# Patient Record
Sex: Female | Born: 1985 | Race: White | Hispanic: No | Marital: Married | Smoking: Former smoker
Health system: Southern US, Community
[De-identification: ages and names within clinical notes are randomized; demographics above are authoritative.]

## PROBLEM LIST (undated history)

## (undated) ENCOUNTER — Inpatient Hospital Stay (HOSPITAL_COMMUNITY): Payer: Self-pay

## (undated) DIAGNOSIS — O09299 Supervision of pregnancy with other poor reproductive or obstetric history, unspecified trimester: Secondary | ICD-10-CM

## (undated) DIAGNOSIS — Z789 Other specified health status: Secondary | ICD-10-CM

## (undated) HISTORY — PX: TYMPANOSTOMY TUBE PLACEMENT: SHX32

## (undated) HISTORY — DX: Supervision of pregnancy with other poor reproductive or obstetric history, unspecified trimester: O09.299

---

## 2008-04-09 ENCOUNTER — Other Ambulatory Visit: Admission: RE | Admit: 2008-04-09 | Discharge: 2008-04-09 | Payer: Self-pay | Admitting: Obstetrics and Gynecology

## 2009-04-14 ENCOUNTER — Other Ambulatory Visit: Admission: RE | Admit: 2009-04-14 | Discharge: 2009-04-14 | Payer: Self-pay | Admitting: Obstetrics and Gynecology

## 2011-12-19 NOTE — L&D Delivery Note (Signed)
Delivery Note At 11:35 PM a viable female was delivered via Vaginal, Spontaneous Delivery (Presentation: Left Occiput Anterior).  APGAR: , ; weight .   Placenta status: manually del>>>intact,  , .  Cord:  with the following complications: None.  Cord pH: not sent  Anesthesia: Epidural  Episiotomy: None Lacerations: sec deg Suture Repair: 3.0 chromic Est. Blood Loss (mL): 500  Mom to postpartum.  Baby to nursery-stable.  Meriel Pica 10/27/2012, 11:57 PM

## 2012-03-27 ENCOUNTER — Other Ambulatory Visit: Payer: Self-pay

## 2012-03-27 LAB — OB RESULTS CONSOLE RUBELLA ANTIBODY, IGM: Rubella: IMMUNE

## 2012-03-27 LAB — OB RESULTS CONSOLE HIV ANTIBODY (ROUTINE TESTING): HIV: NONREACTIVE

## 2012-04-03 LAB — OB RESULTS CONSOLE GC/CHLAMYDIA
Chlamydia: NEGATIVE
Gonorrhea: NEGATIVE

## 2012-07-01 ENCOUNTER — Other Ambulatory Visit (HOSPITAL_COMMUNITY): Payer: Self-pay | Admitting: Obstetrics and Gynecology

## 2012-07-01 DIAGNOSIS — Z0489 Encounter for examination and observation for other specified reasons: Secondary | ICD-10-CM

## 2012-07-09 ENCOUNTER — Ambulatory Visit (HOSPITAL_COMMUNITY)
Admission: RE | Admit: 2012-07-09 | Discharge: 2012-07-09 | Disposition: A | Discharge status: Home / Self Care | Payer: BC Managed Care – PPO | Source: Ambulatory Visit | Attending: Obstetrics and Gynecology | Admitting: Obstetrics and Gynecology

## 2012-07-09 ENCOUNTER — Encounter (HOSPITAL_COMMUNITY): Payer: Self-pay

## 2012-07-09 DIAGNOSIS — Z0489 Encounter for examination and observation for other specified reasons: Secondary | ICD-10-CM

## 2012-07-09 DIAGNOSIS — Z363 Encounter for antenatal screening for malformations: Secondary | ICD-10-CM | POA: Insufficient documentation

## 2012-07-09 DIAGNOSIS — O350XX0 Maternal care for (suspected) central nervous system malformation in fetus, not applicable or unspecified: Secondary | ICD-10-CM | POA: Insufficient documentation

## 2012-07-09 DIAGNOSIS — O358XX0 Maternal care for other (suspected) fetal abnormality and damage, not applicable or unspecified: Secondary | ICD-10-CM | POA: Insufficient documentation

## 2012-07-09 DIAGNOSIS — Z1389 Encounter for screening for other disorder: Secondary | ICD-10-CM | POA: Insufficient documentation

## 2012-07-09 DIAGNOSIS — O3500X0 Maternal care for (suspected) central nervous system malformation or damage in fetus, unspecified, not applicable or unspecified: Secondary | ICD-10-CM | POA: Insufficient documentation

## 2012-07-09 NOTE — Progress Notes (Signed)
Tonya Chan was seen for ultrasound appointment today.  Please see [facility] report for details.  

## 2012-09-25 LAB — OB RESULTS CONSOLE GBS: GBS: NEGATIVE

## 2012-10-27 ENCOUNTER — Encounter (HOSPITAL_COMMUNITY): Payer: Self-pay | Admitting: Anesthesiology

## 2012-10-27 ENCOUNTER — Inpatient Hospital Stay (HOSPITAL_COMMUNITY): Payer: BC Managed Care – PPO | Admitting: Anesthesiology

## 2012-10-27 ENCOUNTER — Inpatient Hospital Stay (HOSPITAL_COMMUNITY)
Admission: AD | Admit: 2012-10-27 | Discharge: 2012-10-29 | DRG: 373 | Disposition: A | Discharge status: Home / Self Care | Payer: BC Managed Care – PPO | Source: Ambulatory Visit | Attending: Obstetrics and Gynecology | Admitting: Obstetrics and Gynecology

## 2012-10-27 ENCOUNTER — Encounter (HOSPITAL_COMMUNITY): Payer: Self-pay | Admitting: Family Medicine

## 2012-10-27 ENCOUNTER — Encounter (HOSPITAL_COMMUNITY): Payer: Self-pay | Admitting: *Deleted

## 2012-10-27 HISTORY — DX: Other specified health status: Z78.9

## 2012-10-27 LAB — CBC
HCT: 41.5 % (ref 36.0–46.0)
MCH: 32.2 pg (ref 26.0–34.0)
MCV: 93.5 fL (ref 78.0–100.0)
Platelets: 233 10*3/uL (ref 150–400)
RBC: 4.44 MIL/uL (ref 3.87–5.11)
RDW: 13.4 % (ref 11.5–15.5)
WBC: 10.8 10*3/uL — ABNORMAL HIGH (ref 4.0–10.5)

## 2012-10-27 LAB — TYPE AND SCREEN

## 2012-10-27 MED ORDER — OXYCODONE-ACETAMINOPHEN 5-325 MG PO TABS: 1.0000 | ORAL_TABLET | ORAL | Status: DC | PRN

## 2012-10-27 MED ORDER — LIDOCAINE HCL (PF) 1 % IJ SOLN
30.0000 mL | INTRAMUSCULAR | Status: DC | PRN
  Filled 2012-10-27: qty 30

## 2012-10-27 MED ORDER — FENTANYL 2.5 MCG/ML BUPIVACAINE 1/10 % EPIDURAL INFUSION (WH - ANES)
14.0000 mL/h | INTRAMUSCULAR | Status: DC
  Filled 2012-10-27: qty 125

## 2012-10-27 MED ORDER — EPHEDRINE 5 MG/ML INJ
10.0000 mg | INTRAVENOUS | Status: DC | PRN
  Filled 2012-10-27: qty 4

## 2012-10-27 MED ORDER — LIDOCAINE HCL (PF) 1 % IJ SOLN
INTRAMUSCULAR | Status: DC | PRN
  Administered 2012-10-27 (×2): 9 mL

## 2012-10-27 MED ORDER — OXYTOCIN 40 UNITS IN LACTATED RINGERS INFUSION - SIMPLE MED: 62.5000 mL/h | INTRAVENOUS | Status: DC

## 2012-10-27 MED ORDER — OXYTOCIN 40 UNITS IN LACTATED RINGERS INFUSION - SIMPLE MED
1.0000 m[IU]/min | INTRAVENOUS | Status: DC
  Administered 2012-10-27: 2 m[IU]/min via INTRAVENOUS
  Filled 2012-10-27: qty 1000

## 2012-10-27 MED ORDER — CITRIC ACID-SODIUM CITRATE 334-500 MG/5ML PO SOLN: 30.0000 mL | ORAL | Status: DC | PRN

## 2012-10-27 MED ORDER — DIPHENHYDRAMINE HCL 50 MG/ML IJ SOLN: 12.5000 mg | INTRAMUSCULAR | Status: DC | PRN

## 2012-10-27 MED ORDER — ONDANSETRON HCL 4 MG/2ML IJ SOLN: 4.0000 mg | Freq: Four times a day (QID) | INTRAMUSCULAR | Status: DC | PRN

## 2012-10-27 MED ORDER — ACETAMINOPHEN 325 MG PO TABS: 650.0000 mg | ORAL_TABLET | ORAL | Status: DC | PRN

## 2012-10-27 MED ORDER — PHENYLEPHRINE 40 MCG/ML (10ML) SYRINGE FOR IV PUSH (FOR BLOOD PRESSURE SUPPORT): 80.0000 ug | PREFILLED_SYRINGE | INTRAVENOUS | Status: DC | PRN

## 2012-10-27 MED ORDER — FLEET ENEMA 7-19 GM/118ML RE ENEM: 1.0000 | ENEMA | RECTAL | Status: DC | PRN

## 2012-10-27 MED ORDER — TERBUTALINE SULFATE 1 MG/ML IJ SOLN: 0.2500 mg | Freq: Once | INTRAMUSCULAR | Status: AC | PRN

## 2012-10-27 MED ORDER — OXYTOCIN BOLUS FROM INFUSION
500.0000 mL | INTRAVENOUS | Status: DC
  Administered 2012-10-27: 500 mL via INTRAVENOUS

## 2012-10-27 MED ORDER — IBUPROFEN 600 MG PO TABS
600.0000 mg | ORAL_TABLET | Freq: Four times a day (QID) | ORAL | Status: DC | PRN
  Administered 2012-10-28: 600 mg via ORAL
  Filled 2012-10-27: qty 1

## 2012-10-27 MED ORDER — LACTATED RINGERS IV SOLN
INTRAVENOUS | Status: DC
  Administered 2012-10-27: 20:00:00 via INTRAVENOUS
  Administered 2012-10-27: 125 mL/h via INTRAVENOUS
  Administered 2012-10-27: 300 mL via INTRAVENOUS

## 2012-10-27 MED ORDER — LACTATED RINGERS IV SOLN: 500.0000 mL | INTRAVENOUS | Status: DC | PRN

## 2012-10-27 MED ORDER — FENTANYL 2.5 MCG/ML BUPIVACAINE 1/10 % EPIDURAL INFUSION (WH - ANES)
INTRAMUSCULAR | Status: DC | PRN
  Administered 2012-10-27: 14 mL/h via EPIDURAL

## 2012-10-27 MED ORDER — EPHEDRINE 5 MG/ML INJ: 10.0000 mg | INTRAVENOUS | Status: DC | PRN

## 2012-10-27 MED ORDER — PHENYLEPHRINE 40 MCG/ML (10ML) SYRINGE FOR IV PUSH (FOR BLOOD PRESSURE SUPPORT)
80.0000 ug | PREFILLED_SYRINGE | INTRAVENOUS | Status: DC | PRN
  Filled 2012-10-27: qty 5

## 2012-10-27 MED ORDER — LACTATED RINGERS IV SOLN: 500.0000 mL | Freq: Once | INTRAVENOUS | Status: DC

## 2012-10-27 NOTE — MAU Note (Signed)
Pt reports having ctx since last night . Thinks her water broke around 5am clear fluid out. Good fetal movement reported.

## 2012-10-27 NOTE — Anesthesia Procedure Notes (Signed)
Epidural Patient location during procedure: OB Start time: 10/27/2012 5:10 PM End time: 10/27/2012 5:14 PM  Staffing Anesthesiologist: Sandrea Hughs  Preanesthetic Checklist Completed: patient identified, site marked, surgical consent, pre-op evaluation, timeout performed, IV checked, risks and benefits discussed and monitors and equipment checked  Epidural Patient position: sitting Prep: site prepped and draped and DuraPrep Patient monitoring: continuous pulse ox and blood pressure Approach: midline Injection technique: LOR air  Needle:  Needle type: Tuohy  Needle gauge: 17 G Needle length: 9 cm and 9 Needle insertion depth: 7 cm Catheter type: closed end flexible Catheter size: 19 Gauge Catheter at skin depth: 12 cm Test dose: negative  Assessment Sensory level: T8 Events: blood not aspirated, injection not painful, no injection resistance, negative IV test and no paresthesia  Additional Notes Reason for block:procedure for pain

## 2012-10-27 NOTE — Progress Notes (Signed)
Voided about 10 cc on bed pan

## 2012-10-27 NOTE — Progress Notes (Signed)
Pt up to bathroom prior to epidural placement 

## 2012-10-27 NOTE — OB Triage Provider Note (Signed)
S: Asked by RN to perform a SSE to R/O ROM Pt reports strong UC's since O500 at which time she felt a pop and then a gush of clear fluid. +FM, denies Vaginal Bleeding  O: VSS, afebrile FHT: 145, moderate with 15X15 accels. No decels Toco: q 5-7 mins External Genitalia: normal Vagina: small amount of pooling of clear fluid seen in Vault Cervix: pink, small amount of fluid seen with valsalva. Fern positive  A/P ROM Reassuring M-F status Category 1 FHR tracing  Care assumed by private.

## 2012-10-27 NOTE — Anesthesia Preprocedure Evaluation (Signed)
Anesthesia Evaluation  Patient identified by MRN, date of birth, ID band Patient awake    Reviewed: Allergy & Precautions, H&P , NPO status , Patient's Chart, lab work & pertinent test results  Airway Mallampati: II TM Distance: >3 FB Neck ROM: full    Dental No notable dental hx.    Pulmonary neg pulmonary ROS,    Pulmonary exam normal       Cardiovascular negative cardio ROS      Neuro/Psych negative neurological ROS  negative psych ROS   GI/Hepatic negative GI ROS, Neg liver ROS,   Endo/Other  Morbid obesity  Renal/GU negative Renal ROS  negative genitourinary   Musculoskeletal negative musculoskeletal ROS (+)   Abdominal (+) + obese,   Peds negative pediatric ROS (+)  Hematology negative hematology ROS (+)   Anesthesia Other Findings   Reproductive/Obstetrics (+) Pregnancy                           Anesthesia Physical Anesthesia Plan  ASA: III  Anesthesia Plan: Epidural   Post-op Pain Management:    Induction:   Airway Management Planned:   Additional Equipment:   Intra-op Plan:   Post-operative Plan:   Informed Consent: I have reviewed the patients History and Physical, chart, labs and discussed the procedure including the risks, benefits and alternatives for the proposed anesthesia with the patient or authorized representative who has indicated his/her understanding and acceptance.     Plan Discussed with:   Anesthesia Plan Comments:         Anesthesia Quick Evaluation  

## 2012-10-27 NOTE — Progress Notes (Signed)
Pooling noted

## 2012-10-27 NOTE — Progress Notes (Signed)
Now C/C/+2, stable FHR

## 2012-10-27 NOTE — Progress Notes (Signed)
Pt moving position frequently, standing to sitting to bed difficult to accurately trace uc's

## 2012-10-27 NOTE — H&P (Signed)
Tonya Chan is a 26 y.o. female presenting for SROM. Maternal Medical History:  Reason for admission: Reason for admission: rupture of membranes.  Contractions: Onset was 3-5 hours ago.   Frequency: irregular.   Perceived severity is mild.    Fetal activity: Perceived fetal activity is normal.      OB History    Grav Para Term Preterm Abortions TAB SAB Ect Mult Living   1 0 0 0 0 0 0 0 0 0      Past Medical History  Diagnosis Date  . No pertinent past medical history    Past Surgical History  Procedure Date  . No past surgeries    Family History: family history is not on file. Social History:  reports that she has quit smoking. She quit smokeless tobacco use about 9 months ago. She reports that she does not drink alcohol or use illicit drugs.   Prenatal Transfer Tool  Maternal Diabetes: No Genetic Screening: Normal Maternal Ultrasounds/Referrals: Normal Fetal Ultrasounds or other Referrals:  None Maternal Substance Abuse:  No Significant Maternal Medications:  None Significant Maternal Lab Results:  None Other Comments:  None  ROS  Dilation: 3.5 Effacement (%): 90 Station: -2 Exam by:: Dr Tonya Chan Blood pressure 122/69, pulse 117, temperature 97.9 F (36.6 C), temperature source Oral, resp. rate 20, height 5\' 1"  (1.549 m), weight 222 lb 9.6 oz (100.971 kg), last menstrual period 01/21/2012. Maternal Exam:  Uterine Assessment: Contraction strength is mild.  Abdomen: Patient reports no abdominal tenderness. Fundal height is term FH, [id number].   Estimated fetal weight is AGA.   Fetal presentation: vertex  Introitus: Normal vulva. Normal vagina.  Amniotic fluid character: clear.  Pelvis: adequate for delivery.   Cervix: Cervix evaluated by digital exam.     Physical Exam  Constitutional: She is oriented to person, place, and time. She appears well-developed and well-nourished.  HENT:  Head: Normocephalic and atraumatic.  Neck: Normal range of motion. Neck  supple.  Cardiovascular: Normal rate and regular rhythm.   Respiratory: Effort normal and breath sounds normal.  GI:       Term FH, FHR 148  Genitourinary:       forebag noted>>>AROM>>clear af///   Now 3-4/90%/vtx/-2  Musculoskeletal: Normal range of motion.  Neurological: She is alert and oriented to person, place, and time.    Prenatal labs: ABO, Rh: --/--/A POS, A POS (11/10 1055) Antibody: NEG (11/10 1055) Rubella: Immune (04/10 0000) RPR: Nonreactive (04/10 0000)  HBsAg: Negative (04/10 0000)  HIV: Non-reactive (04/10 0000)  GBS: Negative (10/09 0000)   Assessment/Plan: Term IUP, SROM w/ irreg ctx>>discussed pit protocol for aug   Tonya Chan M 10/27/2012, 3:51 PM

## 2012-10-28 ENCOUNTER — Encounter (HOSPITAL_COMMUNITY): Payer: Self-pay | Admitting: Family Medicine

## 2012-10-28 LAB — CBC
MCH: 32.3 pg (ref 26.0–34.0)
MCV: 94.2 fL (ref 78.0–100.0)
Platelets: 227 10*3/uL (ref 150–400)
RBC: 3.47 MIL/uL — ABNORMAL LOW (ref 3.87–5.11)

## 2012-10-28 MED ORDER — DIPHENHYDRAMINE HCL 25 MG PO CAPS: 25.0000 mg | ORAL_CAPSULE | Freq: Four times a day (QID) | ORAL | Status: DC | PRN

## 2012-10-28 MED ORDER — ZOLPIDEM TARTRATE 5 MG PO TABS: 5.0000 mg | ORAL_TABLET | Freq: Every evening | ORAL | Status: DC | PRN

## 2012-10-28 MED ORDER — SENNOSIDES-DOCUSATE SODIUM 8.6-50 MG PO TABS
2.0000 | ORAL_TABLET | Freq: Every day | ORAL | Status: DC
  Administered 2012-10-28: 2 via ORAL

## 2012-10-28 MED ORDER — OXYCODONE-ACETAMINOPHEN 5-325 MG PO TABS
1.0000 | ORAL_TABLET | ORAL | Status: DC | PRN
  Administered 2012-10-28: 1 via ORAL
  Administered 2012-10-28: 2 via ORAL
  Administered 2012-10-29 (×2): 1 via ORAL
  Filled 2012-10-28: qty 2
  Filled 2012-10-28 (×3): qty 1

## 2012-10-28 MED ORDER — LANOLIN HYDROUS EX OINT: TOPICAL_OINTMENT | CUTANEOUS | Status: DC | PRN

## 2012-10-28 MED ORDER — DIBUCAINE 1 % RE OINT: 1.0000 "application " | TOPICAL_OINTMENT | RECTAL | Status: DC | PRN

## 2012-10-28 MED ORDER — BENZOCAINE-MENTHOL 20-0.5 % EX AERO
1.0000 "application " | INHALATION_SPRAY | CUTANEOUS | Status: DC | PRN
  Administered 2012-10-29: 1 via TOPICAL
  Filled 2012-10-28: qty 56

## 2012-10-28 MED ORDER — ONDANSETRON HCL 4 MG PO TABS: 4.0000 mg | ORAL_TABLET | ORAL | Status: DC | PRN

## 2012-10-28 MED ORDER — WITCH HAZEL-GLYCERIN EX PADS: 1.0000 "application " | MEDICATED_PAD | CUTANEOUS | Status: DC | PRN

## 2012-10-28 MED ORDER — ONDANSETRON HCL 4 MG/2ML IJ SOLN: 4.0000 mg | INTRAMUSCULAR | Status: DC | PRN

## 2012-10-28 MED ORDER — PRENATAL MULTIVITAMIN CH
1.0000 | ORAL_TABLET | Freq: Every day | ORAL | Status: DC
  Administered 2012-10-28 – 2012-10-29 (×2): 1 via ORAL
  Filled 2012-10-28 (×2): qty 1

## 2012-10-28 MED ORDER — TETANUS-DIPHTH-ACELL PERTUSSIS 5-2.5-18.5 LF-MCG/0.5 IM SUSP
0.5000 mL | Freq: Once | INTRAMUSCULAR | Status: DC
  Filled 2012-10-28: qty 0.5

## 2012-10-28 MED ORDER — SIMETHICONE 80 MG PO CHEW: 80.0000 mg | CHEWABLE_TABLET | ORAL | Status: DC | PRN

## 2012-10-28 MED ORDER — IBUPROFEN 600 MG PO TABS
600.0000 mg | ORAL_TABLET | Freq: Four times a day (QID) | ORAL | Status: DC
  Administered 2012-10-28 – 2012-10-29 (×6): 600 mg via ORAL
  Filled 2012-10-28 (×7): qty 1

## 2012-10-28 NOTE — Progress Notes (Signed)
Post Partum Day 1 Subjective: no complaints, up ad lib, voiding and tolerating PO  Objective: Blood pressure 131/71, pulse 117, temperature 98.1 F (36.7 C), temperature source Oral, resp. rate 20, height 5\' 1"  (1.549 m), weight 100.971 kg (222 lb 9.6 oz), last menstrual period 01/21/2012, SpO2 98.00%, unknown if currently breastfeeding.  Physical Exam:  General: alert and cooperative Lochia: appropriate Uterine Fundus: firm Incision: perineum intact DVT Evaluation: No evidence of DVT seen on physical exam. No significant calf/ankle edema.   Basename 10/28/12 0658 10/27/12 1055  HGB 11.2* 14.3  HCT 32.7* 41.5    Assessment/Plan: Plan for discharge tomorrow   LOS: 1 day   Temima Kutsch G 10/28/2012, 7:55 AM

## 2012-10-28 NOTE — Anesthesia Postprocedure Evaluation (Signed)
  Anesthesia Post-op Note  Patient: Tonya Chan  Procedure(s) Performed: * No procedures listed *  Patient Location: Mother/Baby  Anesthesia Type:Epidural  Level of Consciousness: awake  Airway and Oxygen Therapy: Patient Spontanous Breathing  Post-op Pain: none  Post-op Assessment: Patient's Cardiovascular Status Stable, Respiratory Function Stable, Patent Airway, No signs of Nausea or vomiting, Adequate PO intake, Pain level controlled, No headache, No backache, No residual numbness and No residual motor weakness  Post-op Vital Signs: Reviewed and stable  Complications: No apparent anesthesia complications

## 2012-10-29 MED ORDER — OXYCODONE-ACETAMINOPHEN 5-325 MG PO TABS: 1.0000 | ORAL_TABLET | ORAL | Status: DC | PRN

## 2012-10-29 MED ORDER — IBUPROFEN 600 MG PO TABS: 600.0000 mg | ORAL_TABLET | Freq: Four times a day (QID) | ORAL | Status: DC

## 2012-10-29 NOTE — Discharge Summary (Signed)
Obstetric Discharge Summary Reason for Admission: rupture of membranes Prenatal Procedures: ultrasound Intrapartum Procedures: spontaneous vaginal delivery Postpartum Procedures: none Complications-Operative and Postpartum: 2 degree perineal laceration Hemoglobin  Date Value Range Status  10/28/2012 11.2* 12.0 - 15.0 g/dL Final     DELTA CHECK NOTED     REPEATED TO VERIFY     HCT  Date Value Range Status  10/28/2012 32.7* 36.0 - 46.0 % Final    Physical Exam:  General: alert and cooperative Lochia: appropriate Uterine Fundus: firm Incision: perineum intact DVT Evaluation: No evidence of DVT seen on physical exam. No significant calf/ankle edema.  Discharge Diagnoses: Term Pregnancy-delivered  Discharge Information: Date: 10/29/2012 Activity: pelvic rest Diet: routine Medications: PNV, Ibuprofen and Percocet Condition: stable Instructions: refer to practice specific booklet Discharge to: home   Newborn Data: Live born female  Birth Weight: 8 lb 13 oz (3997 g) APGAR: 9, 9  Home with mother.  Ragnar Waas G 10/29/2012, 8:16 AM

## 2012-11-11 ENCOUNTER — Ambulatory Visit (HOSPITAL_COMMUNITY)
Admission: RE | Admit: 2012-11-11 | Discharge: 2012-11-11 | Disposition: A | Discharge status: Home / Self Care | Payer: BC Managed Care – PPO | Source: Ambulatory Visit | Attending: Obstetrics and Gynecology | Admitting: Obstetrics and Gynecology

## 2012-11-11 NOTE — Progress Notes (Signed)
Adult Lactation Consultation Outpatient Visit Note  Patient Name: [name] Date of Birth: [birth year] Gestational Age at Delivery: [redacted]w[redacted]d Type of Delivery:   Breastfeeding History: Frequency of Breastfeeding: Has been pumping and bottle feeding EBM due to very sore nipples. Had mastitis in left breast last week. Much better now- has 4 more days of antibiotics. Encouraged to take all of them. Nipples still slightly pink but intact now, Length of Feeding:  Voids: QS Stools: QS  Supplementing / Method: Pumping:  Type of Pump: Medela PIS   Frequency: q 3 hours  Volume:  3 oz  Comments:    Consultation Evaluation:  Initial Feeding Assessment: Pre-feed Weight: 8-12.8  3992 Post-feed Weight: 8- 13.2  4040g Amount Transferred: 38 cc's - 30 cc's EBM by bottle Comments: Tonya Chan was very fussy and crying. Mom has EBM with her. Fed 1/2 oz to Tonya Chan to calm her down. She latched and nursed for 10 minutes then off to sleep. Mom did report that it did not hurt and this was much better than before. Reviewed proper positioning of baby and mom. Encouraged mom to relax with shoulders back.   Additional Feeding Assessment: Pre-feed Weight:8- 113.2  Post-feed Weight: 8- 13.3 Amount Transferred:0 Comments: Baby latched to left breast but only a few sucks then mostly sleeping at the breast. Reviewed awakening techniques. Mom pleased that Tonya Chan could latch without hurting.      Total Breast milk Transferred this Visit: 8 cc's Total Supplement Given: 3 oz EBM  Additional Interventions: Encouraged to continue pumping to promote milk supply. To watch for feeding cues and feed Tonya Chan as soon as she notices them before she is so frantic. May give a little milk to calm her if needed. Then try to latch   Follow-Up  [facility] Tues at 11 am for weight OP appointment here Wed  12/4 at 2;30 To call prn for questions/ concerns    Tonya Chan 11/11/2012, 5:16 PM

## 2012-11-20 ENCOUNTER — Ambulatory Visit (HOSPITAL_COMMUNITY): Payer: BC Managed Care – PPO

## 2014-06-23 IMAGING — US US OB DETAIL+14 WK
1 series · 12 of 28 positions shown · non-contrast
Comparison: none

[Series 1: us ob detail+14 wk · 0.21mm/px · 12 of 70 slices shown]
[im 3/70]
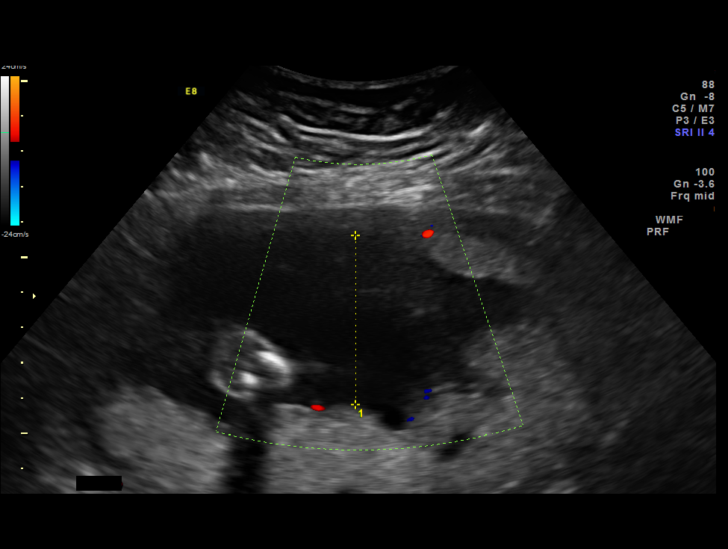
[im 8/70]
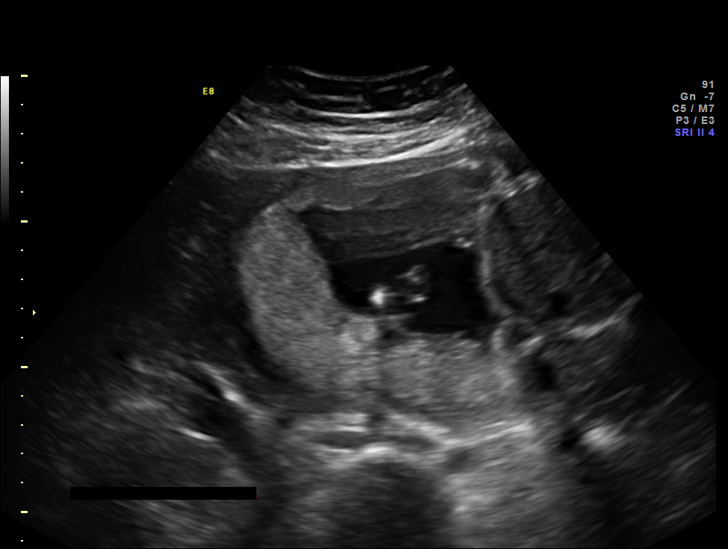
[im 13/70]
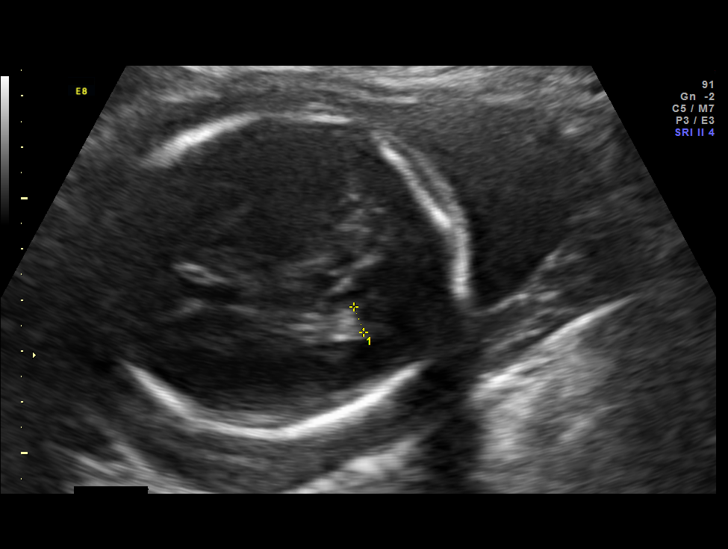
[im 21/70]
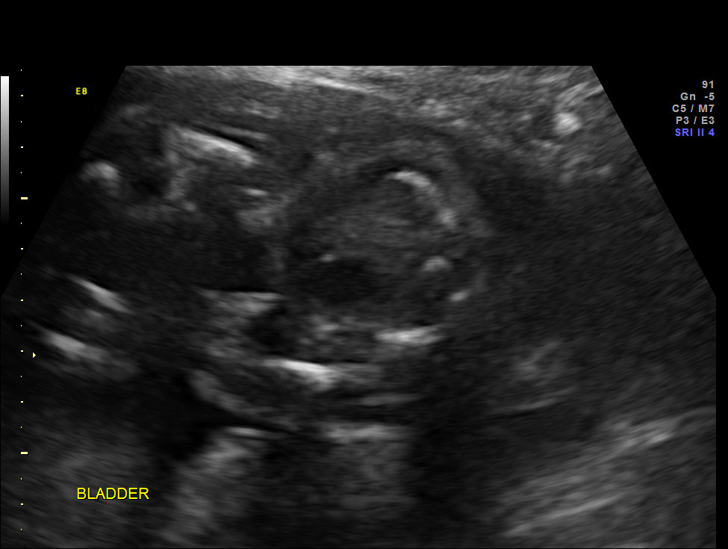
[im 26/70]
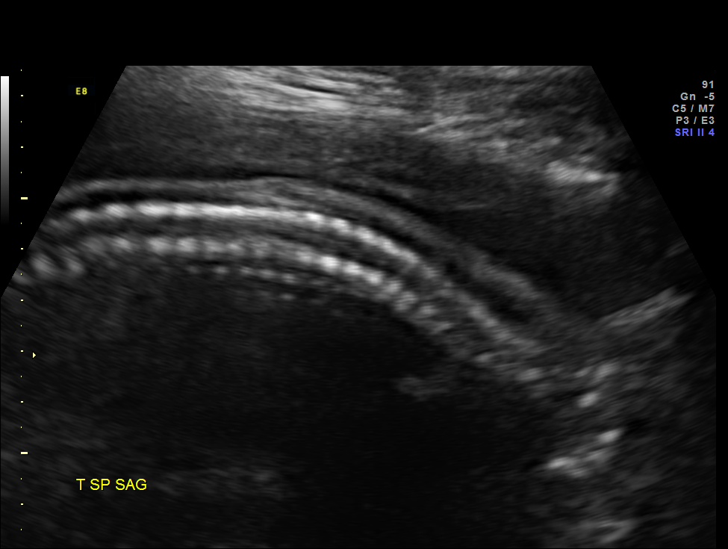
[im 31/70]
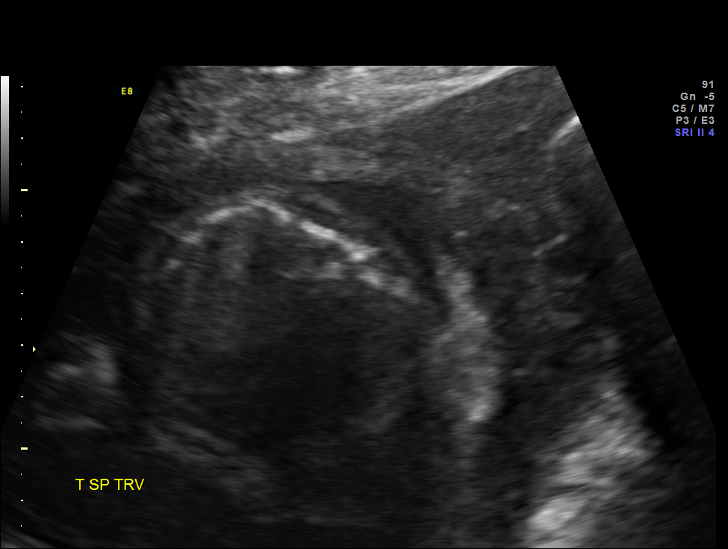
[im 39/70]
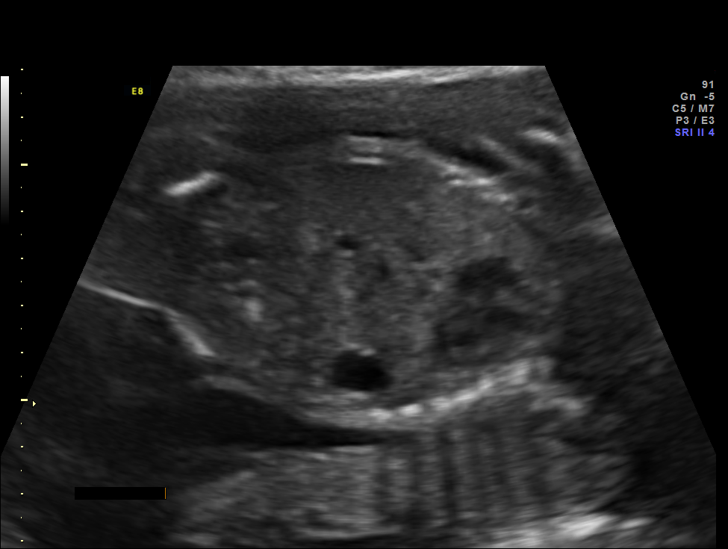
[im 44/70]
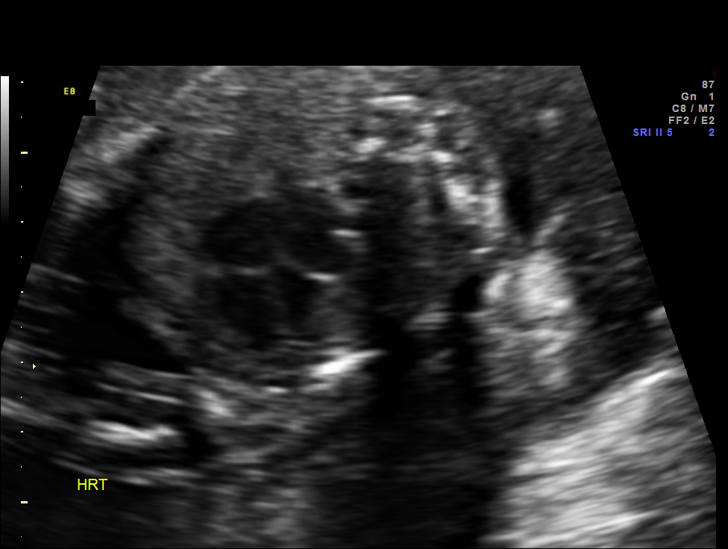
[im 49/70]
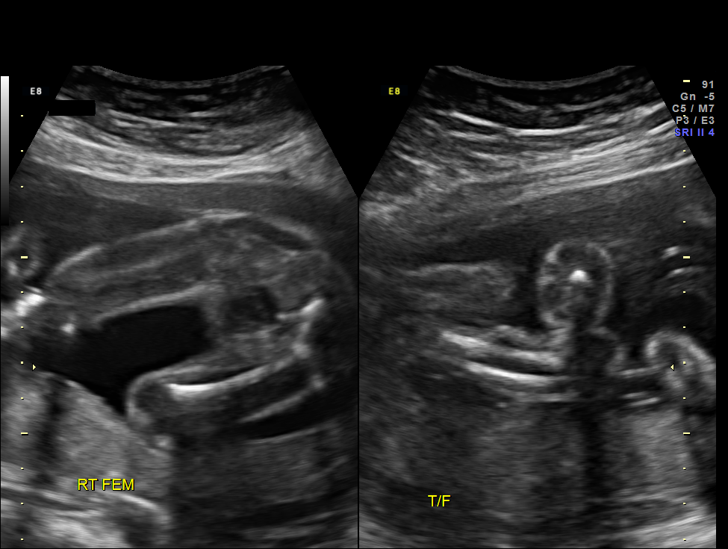
[im 57/70]
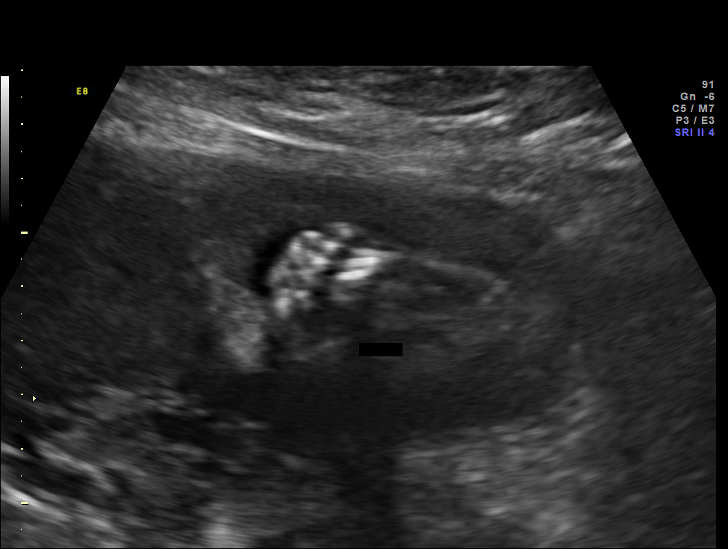
[im 62/70]
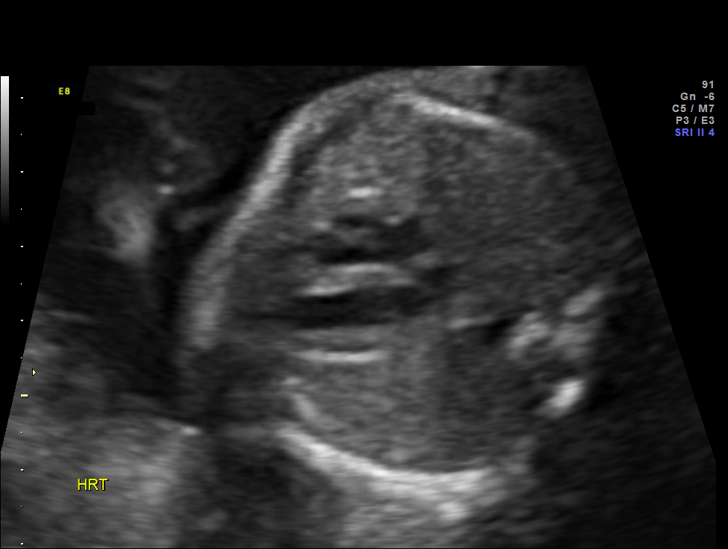
[im 67/70]
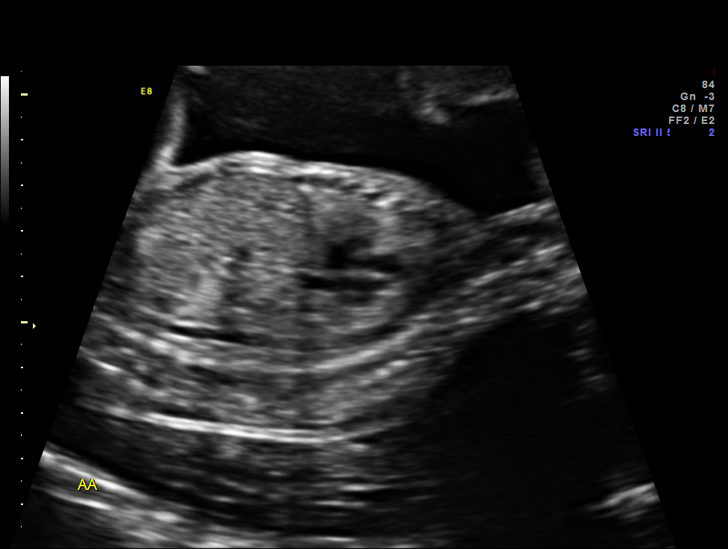

[12 of 28 positions shown; findings below may reference images not displayed]

OBSTETRICS REPORT
                      (Signed Final 07/09/2012 [DATE])

 Order#:         [id number]
Procedures

 US OB DETAIL + 14 WK                                  76811.0
Indications

 Detailed fetal anatomic survey
 Choroid plexus cyst
Fetal Evaluation

 Fetal Heart Rate:  157                          bpm
 Cardiac Activity:  Observed
 Presentation:      Cephalic
 Placenta:          Posterior, above cervical
                    os
 P. Cord            Not well visualized
 Insertion:

 Amniotic Fluid
 AFI FV:      Subjectively within normal limits
                                             Larg Pckt:     4.8  cm
Biometry

 BPD:     61.5  mm     G. Age:  25w 0d                CI:         78.8   70 - 86
 OFD:       78  mm                                    FL/HC:      19.3   18.7 -

 HC:     222.8  mm     G. Age:  24w 2d       32  %    HC/AC:      1.12   1.05 -

 AC:     199.5  mm     G. Age:  24w 4d       51  %    FL/BPD:     69.9   71 - 87
 FL:        43  mm     G. Age:  24w 1d       31  %    FL/AC:      21.6   20 - 24
 HUM:     40.2  mm     G. Age:  24w 3d       46  %
 CER:     26.7  mm     G. Age:  24w 2d       51  %

 Est. FW:     692  gm      1 lb 8 oz     54  %
Gestational Age

 LMP:           24w 2d        Date:  01/21/12                 EDD:   10/27/12
 U/S Today:     24w 4d                                        EDD:   10/25/12
 Best:          24w 2d     Det. By:  LMP  (01/21/12)          EDD:   10/27/12
Anatomy

 Cranium:           Appears normal      Aortic Arch:       Appears normal
 Fetal Cavum:       Appears normal      Ductal Arch:       Not well
                                                           visualized
 Ventricles:        Appears normal      Diaphragm:         Appears normal
 Choroid Plexus:    Resolved CPC        Stomach:           Appears normal
 Cerebellum:        Appears normal      Abdomen:           Appears normal
 Posterior Fossa:   Appears normal      Abdominal Wall:    Appears nml
                                                           (cord insert,
                                                           abd wall)
 Nuchal Fold:       Not applicable      Cord Vessels:      Appears normal
                    (>20 wks GA)                           (3 vessel cord)
 Face:              Orbits appear       Kidneys:           Appear normal
                    normal
 Heart:             Appears normal      Bladder:           Appears normal
                    (4 chamber &
                    axis)
 RVOT:              Appears normal      Spine:             Appears normal
 LVOT:              Appears normal      Limbs:             Appears normal
                                                           (hands, ankles,
                                                           feet)

 Other:     Fetus appears to be a female. Heels and 5th digit
            visualized. Technically difficult due to maternal habitus
            and fetal position.
Targeted Anatomy

 Fetal Central Nervous System
 Cisterna Magna:
Cervix Uterus Adnexa

 Cervical Length:    3.1      cm

 Cervix:       Normal appearance by transabdominal scan.
Comments

 Patient referred due to difficult cardiac views on her prior
 outside ultrasounds.  Cardiac anatomy was visualized today
 and appears normal.  The remainder of fetal anatomy, other
 than the fetal face, were also well visualized.  No fetal
 anomalies were identified.  If visualization of facial structures
 is desired a follow up ultrasound should be ordered at the
 [facility].
Impression

 Single living intrauterine pregnancy at 24 weeks 2 days.
 Appropriate fetal growth (54%).
 Normal amniotic fluid volume.
 No gross fetal anomalies identified.
Recommendations

 Follow-up ultrasounds as clinically indicated.
                Dav, Evi

## 2014-10-06 LAB — OB RESULTS CONSOLE HIV ANTIBODY (ROUTINE TESTING): HIV: NONREACTIVE

## 2014-10-06 LAB — OB RESULTS CONSOLE ANTIBODY SCREEN: ANTIBODY SCREEN: NEGATIVE

## 2014-10-06 LAB — OB RESULTS CONSOLE ABO/RH: RH Type: POSITIVE

## 2014-10-06 LAB — OB RESULTS CONSOLE GC/CHLAMYDIA
Chlamydia: NEGATIVE
Gonorrhea: NEGATIVE

## 2014-10-06 LAB — OB RESULTS CONSOLE RPR: RPR: NONREACTIVE

## 2014-10-06 LAB — OB RESULTS CONSOLE HEPATITIS B SURFACE ANTIGEN: HEP B S AG: NEGATIVE

## 2014-10-06 LAB — OB RESULTS CONSOLE RUBELLA ANTIBODY, IGM: Rubella: IMMUNE

## 2014-10-19 ENCOUNTER — Encounter (HOSPITAL_COMMUNITY): Payer: Self-pay | Admitting: Family Medicine

## 2014-12-18 NOTE — L&D Delivery Note (Signed)
Delivery Note: A viable Female delivered easily via NSVD Apgars 9 9  Weight pending Placenta status:spontaneously with 3 vessel cord but very short , .  Cord:  with the following complications:short .    Anesthesia:  epidural Episiotomy:  none Lacerations:  none Suture Repair: not applicable Est. Blood Loss (mL):  400  Mom to postpartum.  Baby to Couplet care / Skin to Skin.  Arlynn Mcdermid L 05/18/2015, 4:11 PM

## 2014-12-21 ENCOUNTER — Inpatient Hospital Stay (HOSPITAL_COMMUNITY)
Admission: AD | Admit: 2014-12-21 | Payer: BC Managed Care – PPO | Source: Ambulatory Visit | Admitting: Obstetrics and Gynecology

## 2015-04-12 LAB — OB RESULTS CONSOLE GBS: GBS: NEGATIVE

## 2015-05-17 ENCOUNTER — Inpatient Hospital Stay (HOSPITAL_COMMUNITY)
Admission: AD | Admit: 2015-05-17 | Discharge: 2015-05-17 | Disposition: A | Discharge status: Home / Self Care | Payer: BLUE CROSS/BLUE SHIELD | Source: Ambulatory Visit | Attending: Obstetrics and Gynecology | Admitting: Obstetrics and Gynecology

## 2015-05-17 ENCOUNTER — Encounter (HOSPITAL_COMMUNITY): Payer: Self-pay

## 2015-05-17 LAB — URINALYSIS, ROUTINE W REFLEX MICROSCOPIC
Bilirubin Urine: NEGATIVE
GLUCOSE, UA: NEGATIVE mg/dL
Hgb urine dipstick: NEGATIVE
Ketones, ur: NEGATIVE mg/dL
LEUKOCYTES UA: NEGATIVE
Nitrite: NEGATIVE
PROTEIN: NEGATIVE mg/dL
SPECIFIC GRAVITY, URINE: 1.01 (ref 1.005–1.030)
Urobilinogen, UA: 0.2 mg/dL (ref 0.0–1.0)
pH: 7 (ref 5.0–8.0)

## 2015-05-17 LAB — COMPREHENSIVE METABOLIC PANEL
ALT: 15 U/L (ref 14–54)
ANION GAP: 5 (ref 5–15)
AST: 20 U/L (ref 15–41)
Albumin: 3.1 g/dL — ABNORMAL LOW (ref 3.5–5.0)
Alkaline Phosphatase: 98 U/L (ref 38–126)
BILIRUBIN TOTAL: 0.6 mg/dL (ref 0.3–1.2)
BUN: 8 mg/dL (ref 6–20)
CALCIUM: 8.6 mg/dL — AB (ref 8.9–10.3)
CO2: 20 mmol/L — AB (ref 22–32)
CREATININE: 0.48 mg/dL (ref 0.44–1.00)
Chloride: 107 mmol/L (ref 101–111)
GFR calc Af Amer: >60 mL/min (ref >60)
GFR calc non Af Amer: >60 mL/min (ref >60)
Glucose, Bld: 80 mg/dL (ref 65–99)
Potassium: 3.6 mmol/L (ref 3.5–5.1)
SODIUM: 132 mmol/L — AB (ref 135–145)
Total Protein: 6.3 g/dL — ABNORMAL LOW (ref 6.5–8.1)

## 2015-05-17 LAB — CBC
HCT: 38.8 % (ref 36.0–46.0)
Hemoglobin: 13.3 g/dL (ref 12.0–15.0)
MCH: 31.7 pg (ref 26.0–34.0)
MCHC: 34.3 g/dL (ref 30.0–36.0)
MCV: 92.4 fL (ref 78.0–100.0)
Platelets: 234 10*3/uL (ref 150–400)
RBC: 4.2 MIL/uL (ref 3.87–5.11)
RDW: 13.9 % (ref 11.5–15.5)
WBC: 13.1 10*3/uL — AB (ref 4.0–10.5)

## 2015-05-17 LAB — POCT FERN TEST: POCT Fern Test: NEGATIVE

## 2015-05-17 LAB — URIC ACID: Uric Acid, Serum: 5.3 mg/dL (ref 2.3–6.6)

## 2015-05-17 LAB — PROTEIN / CREATININE RATIO, URINE
Creatinine, Urine: 16 mg/dL
Total Protein, Urine: <6 mg/dL

## 2015-05-17 NOTE — MAU Note (Signed)
Contractions irregularly. Denies bright red vaginal bleeding.  Denies bloody show.  Positive fetal movement Complains of LOF since Saturday 5/28 Denies any infections/complications of pregnancy  GBS negative per patient  Plans for epidural during labor

## 2015-05-17 NOTE — MAU Provider Note (Signed)
Medical screening exam done.  Pt was a labor check and blood pressure became elevated on discharge.  Because of this elevation, it became necessary to obtain [facility] labs.  Labs are pending.  Pt denies HA, vision change, epigastric pain, swelling of lower extremities.  She reports all is well and she is merely waiting.   Pt to press button if anything needed.   RN to contact MD with lab results.   Ledell Noss, PA-C

## 2015-05-18 ENCOUNTER — Encounter (HOSPITAL_COMMUNITY): Payer: Self-pay

## 2015-05-18 ENCOUNTER — Inpatient Hospital Stay (HOSPITAL_COMMUNITY): Payer: BLUE CROSS/BLUE SHIELD | Admitting: Anesthesiology

## 2015-05-18 ENCOUNTER — Inpatient Hospital Stay (HOSPITAL_COMMUNITY)
Admission: AD | Admit: 2015-05-18 | Discharge: 2015-05-20 | DRG: 774 | Disposition: A | Discharge status: Home / Self Care | Payer: BLUE CROSS/BLUE SHIELD | Source: Ambulatory Visit | Attending: Obstetrics and Gynecology | Admitting: Obstetrics and Gynecology

## 2015-05-18 DIAGNOSIS — O480 Post-term pregnancy: Principal | ICD-10-CM | POA: Diagnosis present

## 2015-05-18 DIAGNOSIS — Z3A40 40 weeks gestation of pregnancy: Secondary | ICD-10-CM | POA: Diagnosis present

## 2015-05-18 DIAGNOSIS — Z87891 Personal history of nicotine dependence: Secondary | ICD-10-CM

## 2015-05-18 DIAGNOSIS — Z3490 Encounter for supervision of normal pregnancy, unspecified, unspecified trimester: Secondary | ICD-10-CM

## 2015-05-18 LAB — DIC (DISSEMINATED INTRAVASCULAR COAGULATION) PANEL
APTT: 24 s (ref 24–37)
D DIMER QUANT: 2.6 ug{FEU}/mL — AB (ref 0.00–0.48)
FIBRINOGEN: 342 mg/dL (ref 204–475)
INR: 1.1 (ref 0.00–1.49)
Prothrombin Time: 14.4 seconds (ref 11.6–15.2)
SMEAR REVIEW: NONE SEEN

## 2015-05-18 LAB — CBC
HEMATOCRIT: 36.4 % (ref 36.0–46.0)
HEMATOCRIT: 33.5 % — AB (ref 36.0–46.0)
HEMOGLOBIN: 11.4 g/dL — AB (ref 12.0–15.0)
Hemoglobin: 12.4 g/dL (ref 12.0–15.0)
MCH: 31.6 pg (ref 26.0–34.0)
MCH: 31.5 pg (ref 26.0–34.0)
MCHC: 34.1 g/dL (ref 30.0–36.0)
MCHC: 34 g/dL (ref 30.0–36.0)
MCV: 92.9 fL (ref 78.0–100.0)
MCV: 92.5 fL (ref 78.0–100.0)
PLATELETS: 232 10*3/uL (ref 150–400)
Platelets: 254 10*3/uL (ref 150–400)
RBC: 3.92 MIL/uL (ref 3.87–5.11)
RBC: 3.62 MIL/uL — AB (ref 3.87–5.11)
RDW: 14 % (ref 11.5–15.5)
RDW: 14 % (ref 11.5–15.5)
WBC: 10.7 10*3/uL — AB (ref 4.0–10.5)
WBC: 17.3 10*3/uL — AB (ref 4.0–10.5)

## 2015-05-18 LAB — POSTPARTUM HEMORRHAGE PROTOCOL (BB NOTIFICATION)

## 2015-05-18 LAB — DIC (DISSEMINATED INTRAVASCULAR COAGULATION)PANEL: Platelets: 252 10*3/uL (ref 150–400)

## 2015-05-18 MED ORDER — EPHEDRINE 5 MG/ML INJ
10.0000 mg | INTRAVENOUS | Status: DC | PRN
  Filled 2015-05-18: qty 2

## 2015-05-18 MED ORDER — DIPHENHYDRAMINE HCL 50 MG/ML IJ SOLN: 12.5000 mg | INTRAMUSCULAR | Status: DC | PRN

## 2015-05-18 MED ORDER — BISACODYL 10 MG RE SUPP: 10.0000 mg | Freq: Every day | RECTAL | Status: DC | PRN

## 2015-05-18 MED ORDER — SENNOSIDES-DOCUSATE SODIUM 8.6-50 MG PO TABS
2.0000 | ORAL_TABLET | ORAL | Status: DC
  Administered 2015-05-18 – 2015-05-19 (×2): 2 via ORAL
  Filled 2015-05-18 (×2): qty 2

## 2015-05-18 MED ORDER — OXYCODONE-ACETAMINOPHEN 5-325 MG PO TABS: 1.0000 | ORAL_TABLET | ORAL | Status: DC | PRN

## 2015-05-18 MED ORDER — PHENYLEPHRINE 40 MCG/ML (10ML) SYRINGE FOR IV PUSH (FOR BLOOD PRESSURE SUPPORT)
80.0000 ug | PREFILLED_SYRINGE | INTRAVENOUS | Status: DC | PRN
  Filled 2015-05-18: qty 2

## 2015-05-18 MED ORDER — LACTATED RINGERS IV SOLN
500.0000 mL | INTRAVENOUS | Status: DC | PRN
  Administered 2015-05-18: 500 mL via INTRAVENOUS
  Administered 2015-05-18: 1000 mL via INTRAVENOUS

## 2015-05-18 MED ORDER — DIPHENHYDRAMINE HCL 25 MG PO CAPS: 25.0000 mg | ORAL_CAPSULE | Freq: Four times a day (QID) | ORAL | Status: DC | PRN

## 2015-05-18 MED ORDER — CITRIC ACID-SODIUM CITRATE 334-500 MG/5ML PO SOLN: 30.0000 mL | ORAL | Status: DC | PRN

## 2015-05-18 MED ORDER — ONDANSETRON HCL 4 MG/2ML IJ SOLN: 4.0000 mg | Freq: Four times a day (QID) | INTRAMUSCULAR | Status: DC | PRN

## 2015-05-18 MED ORDER — OXYTOCIN 40 UNITS IN LACTATED RINGERS INFUSION - SIMPLE MED
1.0000 m[IU]/min | INTRAVENOUS | Status: DC
  Administered 2015-05-18: 666 m[IU]/min via INTRAVENOUS
  Administered 2015-05-18: 2 m[IU]/min via INTRAVENOUS

## 2015-05-18 MED ORDER — OXYCODONE-ACETAMINOPHEN 5-325 MG PO TABS
2.0000 | ORAL_TABLET | ORAL | Status: DC | PRN
  Administered 2015-05-19 (×3): 2 via ORAL
  Filled 2015-05-18 (×3): qty 2

## 2015-05-18 MED ORDER — OXYTOCIN 40 UNITS IN LACTATED RINGERS INFUSION - SIMPLE MED
250.0000 mL/h | INTRAVENOUS | Status: DC
  Administered 2015-05-18: 999 mL/h via INTRAVENOUS

## 2015-05-18 MED ORDER — METHYLERGONOVINE MALEATE 0.2 MG/ML IJ SOLN
0.2000 mg | INTRAMUSCULAR | Status: DC | PRN
  Administered 2015-05-18 (×2): 0.2 mg via INTRAMUSCULAR

## 2015-05-18 MED ORDER — LACTATED RINGERS IV BOLUS (SEPSIS)
1000.0000 mL | Freq: Once | INTRAVENOUS | Status: AC
  Administered 2015-05-18: 500 mL via INTRAVENOUS

## 2015-05-18 MED ORDER — OXYTOCIN 40 UNITS IN LACTATED RINGERS INFUSION - SIMPLE MED
INTRAVENOUS | Status: AC
  Administered 2015-05-18: 2 m[IU]/min via INTRAVENOUS
  Filled 2015-05-18: qty 1000

## 2015-05-18 MED ORDER — FENTANYL 2.5 MCG/ML BUPIVACAINE 1/10 % EPIDURAL INFUSION (WH - ANES)
12.0000 mL/h | INTRAMUSCULAR | Status: DC | PRN
  Administered 2015-05-18: 12 mL/h via EPIDURAL

## 2015-05-18 MED ORDER — METHYLERGONOVINE MALEATE 0.2 MG PO TABS: 0.2000 mg | ORAL_TABLET | ORAL | Status: DC | PRN

## 2015-05-18 MED ORDER — BENZOCAINE-MENTHOL 20-0.5 % EX AERO: 1.0000 "application " | INHALATION_SPRAY | CUTANEOUS | Status: DC | PRN

## 2015-05-18 MED ORDER — MEASLES, MUMPS & RUBELLA VAC ~~LOC~~ INJ: 0.5000 mL | INJECTION | Freq: Once | SUBCUTANEOUS | Status: DC

## 2015-05-18 MED ORDER — TETANUS-DIPHTH-ACELL PERTUSSIS 5-2.5-18.5 LF-MCG/0.5 IM SUSP: 0.5000 mL | Freq: Once | INTRAMUSCULAR | Status: DC

## 2015-05-18 MED ORDER — DIBUCAINE 1 % RE OINT: 1.0000 "application " | TOPICAL_OINTMENT | RECTAL | Status: DC | PRN

## 2015-05-18 MED ORDER — PHENYLEPHRINE 40 MCG/ML (10ML) SYRINGE FOR IV PUSH (FOR BLOOD PRESSURE SUPPORT)
80.0000 ug | PREFILLED_SYRINGE | INTRAVENOUS | Status: DC | PRN
Start: 2015-05-18 — End: 2015-05-18
  Filled 2015-05-18: qty 20

## 2015-05-18 MED ORDER — FLEET ENEMA 7-19 GM/118ML RE ENEM: 1.0000 | ENEMA | Freq: Every day | RECTAL | Status: DC | PRN

## 2015-05-18 MED ORDER — ONDANSETRON HCL 4 MG PO TABS: 4.0000 mg | ORAL_TABLET | ORAL | Status: DC | PRN

## 2015-05-18 MED ORDER — TERBUTALINE SULFATE 1 MG/ML IJ SOLN
0.2500 mg | Freq: Once | INTRAMUSCULAR | Status: DC | PRN
  Filled 2015-05-18: qty 1

## 2015-05-18 MED ORDER — LACTATED RINGERS IV SOLN: INTRAVENOUS | Status: DC

## 2015-05-18 MED ORDER — IBUPROFEN 600 MG PO TABS
600.0000 mg | ORAL_TABLET | Freq: Four times a day (QID) | ORAL | Status: DC
  Administered 2015-05-18 – 2015-05-20 (×6): 600 mg via ORAL
  Filled 2015-05-18 (×6): qty 1

## 2015-05-18 MED ORDER — MEDROXYPROGESTERONE ACETATE 150 MG/ML IM SUSP: 150.0000 mg | INTRAMUSCULAR | Status: DC | PRN

## 2015-05-18 MED ORDER — LANOLIN HYDROUS EX OINT: TOPICAL_OINTMENT | CUTANEOUS | Status: DC | PRN

## 2015-05-18 MED ORDER — OXYTOCIN 40 UNITS IN LACTATED RINGERS INFUSION - SIMPLE MED: 62.5000 mL/h | INTRAVENOUS | Status: DC

## 2015-05-18 MED ORDER — SIMETHICONE 80 MG PO CHEW: 80.0000 mg | CHEWABLE_TABLET | ORAL | Status: DC | PRN

## 2015-05-18 MED ORDER — ONDANSETRON HCL 4 MG/2ML IJ SOLN
4.0000 mg | INTRAMUSCULAR | Status: DC | PRN
Start: 2015-05-18 — End: 2015-05-20

## 2015-05-18 MED ORDER — OXYCODONE-ACETAMINOPHEN 5-325 MG PO TABS: 2.0000 | ORAL_TABLET | ORAL | Status: DC | PRN

## 2015-05-18 MED ORDER — METHYLERGONOVINE MALEATE 0.2 MG/ML IJ SOLN
0.2000 mg | Freq: Once | INTRAMUSCULAR | Status: DC
  Filled 2015-05-18: qty 1

## 2015-05-18 MED ORDER — ACETAMINOPHEN 325 MG PO TABS: 650.0000 mg | ORAL_TABLET | ORAL | Status: DC | PRN

## 2015-05-18 MED ORDER — LIDOCAINE HCL (PF) 1 % IJ SOLN
INTRAMUSCULAR | Status: DC | PRN
  Administered 2015-05-18: 3 mL
  Administered 2015-05-18: 4 mL

## 2015-05-18 MED ORDER — ZOLPIDEM TARTRATE 5 MG PO TABS: 5.0000 mg | ORAL_TABLET | Freq: Every evening | ORAL | Status: DC | PRN

## 2015-05-18 MED ORDER — MISOPROSTOL 200 MCG PO TABS: 800.0000 ug | ORAL_TABLET | Freq: Once | ORAL | Status: DC

## 2015-05-18 MED ORDER — OXYTOCIN BOLUS FROM INFUSION: 500.0000 mL | INTRAVENOUS | Status: DC

## 2015-05-18 MED ORDER — LIDOCAINE HCL (PF) 1 % IJ SOLN
30.0000 mL | INTRAMUSCULAR | Status: DC | PRN
  Filled 2015-05-18: qty 30

## 2015-05-18 MED ORDER — WITCH HAZEL-GLYCERIN EX PADS: 1.0000 "application " | MEDICATED_PAD | CUTANEOUS | Status: DC | PRN

## 2015-05-18 MED ORDER — MISOPROSTOL 200 MCG PO TABS
ORAL_TABLET | ORAL | Status: AC
  Administered 2015-05-18: 1000 ug
  Filled 2015-05-18: qty 5

## 2015-05-18 MED ORDER — FENTANYL 2.5 MCG/ML BUPIVACAINE 1/10 % EPIDURAL INFUSION (WH - ANES)
14.0000 mL/h | INTRAMUSCULAR | Status: DC | PRN
  Administered 2015-05-18: 14 mL/h via EPIDURAL
  Filled 2015-05-18: qty 125

## 2015-05-18 MED ORDER — PRENATAL MULTIVITAMIN CH
1.0000 | ORAL_TABLET | Freq: Every day | ORAL | Status: DC
  Administered 2015-05-19: 1 via ORAL
  Filled 2015-05-18: qty 1

## 2015-05-18 NOTE — Progress Notes (Signed)
Further manual evacuation of uterus by dr. Helane Rima

## 2015-05-18 NOTE — Anesthesia Procedure Notes (Signed)
Epidural Patient location during procedure: OB Start time: 05/18/2015 10:21 AM  Staffing Anesthesiologist: Josephine Igo Performed by: anesthesiologist   Preanesthetic Checklist Completed: patient identified, site marked, surgical consent, pre-op evaluation, timeout performed, IV checked, risks and benefits discussed and monitors and equipment checked  Epidural Patient position: sitting Prep: site prepped and draped and DuraPrep Patient monitoring: continuous pulse ox and blood pressure Approach: midline Location: L3-L4 Injection technique: LOR air  Needle:  Needle type: Tuohy  Needle gauge: 17 G Needle length: 9 cm and 9 Needle insertion depth: 5 cm cm Catheter type: closed end flexible Catheter size: 19 Gauge Catheter at skin depth: 10 cm Test dose: negative and Other  Assessment Events: blood not aspirated, injection not painful, no injection resistance, negative IV test and no paresthesia  Additional Notes Patient identified. Risks and benefits discussed including failed block, incomplete  Pain control, post dural puncture headache, nerve damage, paralysis, blood pressure Changes, nausea, vomiting, reactions to medications-both toxic and allergic and post Partum back pain. All questions were answered. Patient expressed understanding and wished to proceed. Sterile technique was used throughout procedure. Epidural site was Dressed with sterile barrier dressing. No paresthesias, signs of intravascular injection Or signs of intrathecal spread were encountered.  Patient was more comfortable after the epidural was dosed. Please see RN's note for documentation of vital signs and FHR which are stable.

## 2015-05-18 NOTE — Progress Notes (Signed)
RN called for staff assistance

## 2015-05-18 NOTE — Progress Notes (Signed)
Notified by L and D that patient was having a PPH.  I arrived within 2 minutes to Patient's room. Upon arrival, noted about 10 staff in room including Dr. Royce Macadamia and resident. Resident was doing uterine exploration and had inserted cytotec rectally. Patient's bleeding much improved. VSS Stable and patient very coherent My exam revealed that patient's uterus was now firm and no retained tissue felt.  Will obtain CBC I do not feel blood transfusion is necessary because she is hemodynamically stable. Will give IM methergine.

## 2015-05-18 NOTE — Progress Notes (Signed)
Called Dr. Renata Caprice phone and left message that a code hemmarage has been called for her pt.

## 2015-05-18 NOTE — Anesthesia Preprocedure Evaluation (Signed)
Anesthesia Evaluation  Patient identified by MRN, date of birth, ID band Patient awake    Reviewed: Allergy & Precautions, Patient's Chart, lab work & pertinent test results  Airway Mallampati: III  TM Distance: >3 FB Neck ROM: Full    Dental no notable dental hx. (+) Teeth Intact   Pulmonary former smoker,  breath sounds clear to auscultation  Pulmonary exam normal       Cardiovascular negative cardio ROS Normal cardiovascular examRhythm:Regular Rate:Normal     Neuro/Psych negative neurological ROS  negative psych ROS   GI/Hepatic Neg liver ROS, GERD-  ,  Endo/Other  Morbid obesity  Renal/GU negative Renal ROS  negative genitourinary   Musculoskeletal negative musculoskeletal ROS (+)   Abdominal (+) + obese,   Peds  Hematology negative hematology ROS (+)   Anesthesia Other Findings   Reproductive/Obstetrics (+) Pregnancy                             Anesthesia Physical Anesthesia Plan  ASA: III  Anesthesia Plan: Epidural   Post-op Pain Management:    Induction:   Airway Management Planned: Natural Airway  Additional Equipment:   Intra-op Plan:   Post-operative Plan:   Informed Consent: I have reviewed the patients History and Physical, chart, labs and discussed the procedure including the risks, benefits and alternatives for the proposed anesthesia with the patient or authorized representative who has indicated his/her understanding and acceptance.     Plan Discussed with: Anesthesiologist  Anesthesia Plan Comments:         Anesthesia Quick Evaluation

## 2015-05-18 NOTE — Progress Notes (Signed)
Manual uterine clot evacuation by dr. Nehemiah Settle

## 2015-05-18 NOTE — Progress Notes (Signed)
Responded to code hemorrhage.  Pt had blood loss of 460mL at time of delivery, which was approximately 1 hr ago.  RN expressed another 5109mL of clot a few moments prior to code hemorrhage being called.  Upon my arrival, foley catheter being placed.  Bimanual exam done and another 218mL of clot removed from vaginal vault.  Dr Royce Macadamia placed a second IV and BP reading showed SBP in the low 80s.  Cytotec 1062mcg placed rectally.  Dr Helane Rima then arrived and assumed care.  Truett Mainland, DO 05/18/2015 5:12 PM

## 2015-05-18 NOTE — H&P (Signed)
Tonya Chan is a 29 y.o. G 2 P 1 at 40 w 5 days presents for post dates induction.  Ultrasound on Friday revealed EFW at 7 pound 14 oz. Desires epidural Maternal Medical History:  Fetal activity: Perceived fetal activity is normal.      OB History    Gravida Para Term Preterm AB TAB SAB Ectopic Multiple Living   2 1 1  0 0 0 0 0 0 1     Past Medical History  Diagnosis Date  . No pertinent past medical history    Past Surgical History  Procedure Laterality Date  . No past surgeries     Family History: family history is not on file. Social History:  reports that she quit smoking about 14 months ago. She quit smokeless tobacco use about 3 years ago. She reports that she does not drink alcohol or use illicit drugs.   Prenatal Transfer Tool  Maternal Diabetes: No Genetic Screening: Normal Maternal Ultrasounds/Referrals: Normal Fetal Ultrasounds or other Referrals:  None Maternal Substance Abuse:  No Significant Maternal Medications:  None Significant Maternal Lab Results:  None Other Comments:  None  Review of Systems  All other systems reviewed and are negative.   Dilation: 4 Effacement (%): 80 Station: -2 Exam by:: Dr. Helane Rima unknown if currently breastfeeding. Maternal Exam:  Abdomen: Fetal presentation: vertex     Fetal Exam Fetal State Assessment: Category I - tracings are normal.     Physical Exam  Nursing note and vitals reviewed. Constitutional: She appears well-developed and well-nourished.  HENT:  Head: Normocephalic and atraumatic.  Eyes: Pupils are equal, round, and reactive to light.  Neck: Normal range of motion.  Cardiovascular: Normal rate and regular rhythm.   Respiratory: Effort normal.    Prenatal labs: ABO, Rh:   Antibody:   Rubella:   RPR:    HBsAg:    HIV:    GBS: Negative (04/25 0000)   Assessment/Plan: IUP at 40 w 5 days Post dates induction Pitocin per protocol - risks reviewed epidural  Tonya Chan  L 05/18/2015, 8:09 AM

## 2015-05-18 NOTE — Progress Notes (Signed)
Code hemorhage called

## 2015-05-19 LAB — RPR: RPR: NONREACTIVE

## 2015-05-19 LAB — CBC
HCT: 30.5 % — ABNORMAL LOW (ref 36.0–46.0)
HEMOGLOBIN: 10.3 g/dL — AB (ref 12.0–15.0)
MCH: 31.2 pg (ref 26.0–34.0)
MCHC: 33.8 g/dL (ref 30.0–36.0)
MCV: 92.4 fL (ref 78.0–100.0)
PLATELETS: 214 10*3/uL (ref 150–400)
RBC: 3.3 MIL/uL — ABNORMAL LOW (ref 3.87–5.11)
RDW: 14.1 % (ref 11.5–15.5)
WBC: 14.3 10*3/uL — ABNORMAL HIGH (ref 4.0–10.5)

## 2015-05-19 NOTE — Lactation Note (Signed)
This note was copied from the chart of Tonya Joelee Snoke. Lactation Consultation Note  Initial visit made.  Breastfeeding consultation services and support information given and reviewed with patient.  Mom has hx of sore nipples with first baby and she pumped and bottle fed.  She states this newborn is latching better.  Observed mom position baby in cross cradle hold.  I demonstrated good breast support for easier and deeper latch.  After a few attempts baby latched well and nursed actively with stimulation and breast massage needed.  Reviewed basics and answered questions.  Encouraged to call with questions or assist prn.  Patient Name: Tonya Chan [id number] Date: 05/19/2015 Reason for consult: Initial assessment   Maternal Data    Feeding Feeding Type: Breast Fed Length of feed: 15 min  LATCH Score/Interventions Latch: Grasps breast easily, tongue down, lips flanged, rhythmical sucking. Intervention(s): Teach feeding cues;Waking techniques Intervention(s): Breast compression;Breast massage;Assist with latch  Audible Swallowing: A few with stimulation Intervention(s): Hand expression Intervention(s): Hand expression;Alternate breast massage  Type of Nipple: Everted at rest and after stimulation  Comfort (Breast/Nipple): Soft / non-tender     Hold (Positioning): Assistance needed to correctly position infant at breast and maintain latch.  LATCH Score: 8  Lactation Tools Discussed/Used Date initiated:: 05/20/15   Consult Status Consult Status: Follow-up    [name] 05/19/2015, 3:37 PM

## 2015-05-19 NOTE — Anesthesia Postprocedure Evaluation (Signed)
  Anesthesia Post-op Note  Patient: Tonya Chan  Procedure(s) Performed: * No procedures listed *  Patient Location: Mother/Baby  Anesthesia Type:Epidural  Level of Consciousness: awake, alert , oriented and patient cooperative  Airway and Oxygen Therapy: Patient Spontanous Breathing  Post-op Pain: mild  Post-op Assessment: Patient's Cardiovascular Status Stable, Respiratory Function Stable, No headache, No backache, No residual numbness and No residual motor weakness  Post-op Vital Signs: stable  Last Vitals:  Filed Vitals:   05/19/15 0352  BP: 132/67  Pulse: 94  Temp: 36.3 C  Resp: 20    Complications: No apparent anesthesia complications

## 2015-05-19 NOTE — Progress Notes (Signed)
Post Partum Day 1 Subjective: no complaints, up ad lib, voiding, tolerating PO and + flatus  Objective: Blood pressure 132/67, pulse 94, temperature 97.4 F (36.3 C), temperature source Oral, resp. rate 20, height 5\' 1"  (1.549 m), weight 219 lb (99.338 kg), SpO2 100 %, unknown if currently breastfeeding.  Physical Exam:  General: alert and cooperative Lochia: appropriate Uterine Fundus: firm Incision: healing well DVT Evaluation: No evidence of DVT seen on physical exam. Negative Homan's sign. No cords or calf tenderness. Calf/Ankle edema is present.   Recent Labs  05/18/15 1710 05/19/15 0600  HGB 11.4* 10.3*  HCT 33.5* 30.5*    Assessment/Plan: Plan for discharge tomorrow, plan recheck HGB in am   LOS: 1 day   CURTIS,CAROL G 05/19/2015, 8:12 AM

## 2015-05-20 LAB — HEMOGLOBIN AND HEMATOCRIT, BLOOD
HEMATOCRIT: 28.2 % — AB (ref 36.0–46.0)
HEMOGLOBIN: 9.7 g/dL — AB (ref 12.0–15.0)

## 2015-05-20 MED ORDER — IBUPROFEN 600 MG PO TABS: 600.0000 mg | ORAL_TABLET | Freq: Four times a day (QID) | ORAL | Status: DC | PRN

## 2015-05-20 MED ORDER — OXYCODONE-ACETAMINOPHEN 5-325 MG PO TABS: 1.0000 | ORAL_TABLET | Freq: Four times a day (QID) | ORAL | Status: DC | PRN

## 2015-05-20 NOTE — Discharge Summary (Signed)
Obstetric Discharge Summary Reason for Admission: induction of labor Prenatal Procedures: none Intrapartum Procedures: spontaneous vaginal delivery Postpartum Procedures: cytotec rectally x 1 post partum Complications-Operative and Postpartum: hemorrhage HEMOGLOBIN  Date Value Ref Range Status  05/20/2015 9.7* 12.0 - 15.0 g/dL Final   HCT  Date Value Ref Range Status  05/20/2015 28.2* 36.0 - 46.0 % Final    Physical Exam:  General: alert, cooperative and no distress Lochia: appropriate Uterine Fundus: firm Incision: healing well DVT Evaluation: No evidence of DVT seen on physical exam.  Discharge Diagnoses: Term Pregnancy-delivered  Discharge Information: Date: 05/20/2015 Activity: pelvic rest Diet: routine Medications: PNV, Ibuprofen and Percocet Condition: stable Instructions: refer to practice specific booklet Discharge to: home Follow-up Information    Follow up In 6 weeks.      Newborn Data: Live born female  Birth Weight: 8 lb 9.4 oz (3895 g) APGAR: 9, 9  Home with mother.  Tonya Chan,Tonya Chan 05/20/2015, 8:13 AM

## 2015-05-20 NOTE — Lactation Note (Signed)
This note was copied from the chart of Tonya Kortni Hasten. Lactation Consultation Note  Mother getting ready to be discharged.  She had PPH. Reminded her to drink plenty of fluids and if she notices any drop her milk supply she can post pump w/ DEBP. Mother has phone number to call for further questions.  Patient Name: Tonya Chan [id number] Date: 05/20/2015     Maternal Data    Feeding Feeding Type: Breast Fed Length of feed: 35 min  LATCH Score/Interventions                      Lactation Tools Discussed/Used     Consult Status      Carlye Grippe 05/20/2015, 10:45 AM

## 2015-05-20 NOTE — Progress Notes (Signed)
Post Partum Day 2 Subjective: no complaints, up ad lib, voiding, tolerating PO and + flatus  Objective: Blood pressure 119/73, pulse 103, temperature 98.2 F (36.8 C), temperature source Oral, resp. rate 18, height 5\' 1"  (1.549 m), weight 219 lb (99.338 kg), SpO2 100 %, unknown if currently breastfeeding.  Physical Exam:  General: alert, cooperative and no distress Lochia: appropriate Uterine Fundus: firm Incision: healing well DVT Evaluation: No evidence of DVT seen on physical exam.   Recent Labs  05/19/15 0600 05/20/15 0635  HGB 10.3* 9.7*  HCT 30.5* 28.2*    Assessment/Plan: Discharge home   LOS: 2 days   Ancil Dewan II,Birtie Fellman E 05/20/2015, 8:09 AM

## 2015-05-21 LAB — TYPE AND SCREEN
ABO/RH(D): A POS
ANTIBODY SCREEN: NEGATIVE
UNIT DIVISION: 0
UNIT DIVISION: 0
UNIT DIVISION: 0
Unit division: 0

## 2018-07-09 LAB — OB RESULTS CONSOLE RUBELLA ANTIBODY, IGM: Rubella: IMMUNE

## 2018-07-09 LAB — OB RESULTS CONSOLE GC/CHLAMYDIA
Chlamydia: NEGATIVE
Gonorrhea: NEGATIVE

## 2018-07-09 LAB — OB RESULTS CONSOLE HEPATITIS B SURFACE ANTIGEN: Hepatitis B Surface Ag: NEGATIVE

## 2018-07-09 LAB — OB RESULTS CONSOLE ABO/RH: RH TYPE: POSITIVE

## 2018-07-09 LAB — OB RESULTS CONSOLE RPR: RPR: NONREACTIVE

## 2018-07-09 LAB — OB RESULTS CONSOLE HIV ANTIBODY (ROUTINE TESTING): HIV: NONREACTIVE

## 2018-07-09 LAB — OB RESULTS CONSOLE ANTIBODY SCREEN: Antibody Screen: NEGATIVE

## 2018-12-12 ENCOUNTER — Inpatient Hospital Stay (HOSPITAL_COMMUNITY)
Admission: AD | Admit: 2018-12-12 | Discharge: 2018-12-12 | Disposition: A | Discharge status: Home / Self Care | Payer: BLUE CROSS/BLUE SHIELD | Source: Ambulatory Visit | Attending: Obstetrics and Gynecology | Admitting: Obstetrics and Gynecology

## 2018-12-12 ENCOUNTER — Other Ambulatory Visit: Payer: Self-pay

## 2018-12-12 ENCOUNTER — Inpatient Hospital Stay (HOSPITAL_COMMUNITY): Payer: BLUE CROSS/BLUE SHIELD

## 2018-12-12 ENCOUNTER — Encounter (HOSPITAL_COMMUNITY): Payer: Self-pay | Admitting: *Deleted

## 2018-12-12 DIAGNOSIS — Z87891 Personal history of nicotine dependence: Secondary | ICD-10-CM | POA: Insufficient documentation

## 2018-12-12 DIAGNOSIS — Z3A31 31 weeks gestation of pregnancy: Secondary | ICD-10-CM

## 2018-12-12 DIAGNOSIS — O26893 Other specified pregnancy related conditions, third trimester: Secondary | ICD-10-CM

## 2018-12-12 DIAGNOSIS — R0602 Shortness of breath: Secondary | ICD-10-CM

## 2018-12-12 DIAGNOSIS — B9689 Other specified bacterial agents as the cause of diseases classified elsewhere: Secondary | ICD-10-CM

## 2018-12-12 DIAGNOSIS — J069 Acute upper respiratory infection, unspecified: Secondary | ICD-10-CM | POA: Diagnosis not present

## 2018-12-12 DIAGNOSIS — R05 Cough: Secondary | ICD-10-CM | POA: Diagnosis present

## 2018-12-12 DIAGNOSIS — O99513 Diseases of the respiratory system complicating pregnancy, third trimester: Secondary | ICD-10-CM | POA: Insufficient documentation

## 2018-12-12 LAB — URINALYSIS, ROUTINE W REFLEX MICROSCOPIC
BILIRUBIN URINE: NEGATIVE
Glucose, UA: NEGATIVE mg/dL
Hgb urine dipstick: NEGATIVE
KETONES UR: 20 mg/dL — AB
Leukocytes, UA: NEGATIVE
Nitrite: NEGATIVE
Protein, ur: NEGATIVE mg/dL
SPECIFIC GRAVITY, URINE: 1.003 — AB (ref 1.005–1.030)
pH: 6 (ref 5.0–8.0)

## 2018-12-12 MED ORDER — GUAIFENESIN ER 600 MG PO TB12
1200.0000 mg | ORAL_TABLET | Freq: Once | ORAL | Status: AC
  Administered 2018-12-12: 1200 mg via ORAL
  Filled 2018-12-12: qty 2

## 2018-12-12 MED ORDER — AZITHROMYCIN 250 MG PO TABS: ORAL_TABLET | ORAL | 0 refills | Status: DC

## 2018-12-12 MED ORDER — AZITHROMYCIN 250 MG PO TABS
500.0000 mg | ORAL_TABLET | Freq: Once | ORAL | Status: AC
  Administered 2018-12-12: 500 mg via ORAL
  Filled 2018-12-12: qty 2

## 2018-12-12 MED ORDER — PSEUDOEPHEDRINE HCL 30 MG PO TABS
60.0000 mg | ORAL_TABLET | Freq: Once | ORAL | Status: AC
  Administered 2018-12-12: 60 mg via ORAL
  Filled 2018-12-12: qty 2

## 2018-12-12 MED ORDER — IPRATROPIUM-ALBUTEROL 0.5-2.5 (3) MG/3ML IN SOLN
3.0000 mL | Freq: Four times a day (QID) | RESPIRATORY_TRACT | Status: DC
  Administered 2018-12-12: 3 mL via RESPIRATORY_TRACT
  Filled 2018-12-12: qty 3

## 2018-12-12 NOTE — MAU Note (Signed)
Urine in lab 

## 2018-12-12 NOTE — MAU Provider Note (Signed)
Chief Complaint:  Cough and Shortness of Breath   First Provider Initiated Contact with Patient 12/12/18 1838      HPI: Tonya Chan is a 32 y.o. G3P2002 at 9w6dwho presents to maternity admissions reporting worsening cough and shortness of breath with upper respiratory infection. She reports symptoms x 2 weeks of congestion and cough without fever. OTC medications were working last week but last few days congestion has moved from her head to her chest making her short of breath.  She reports episodes of coughing that are becoming more frequent.  There are no other symptoms. She ran out of her Sudafed so has not taken any medications today.   She reports good fetal movement, denies abdominal pain, LOF, vaginal bleeding, vaginal itching/burning, urinary symptoms, h/a, dizziness, n/v, or fever/chills.    HPI  Past Medical History: Past Medical History:  Diagnosis Date  . No pertinent past medical history     Past obstetric history: OB History  Gravida Para Term Preterm AB Living  3 2 2  0 0 2  SAB TAB Ectopic Multiple Live Births  0 0 0 0 2    # Outcome Date GA Lbr Len/2nd Weight Sex Delivery Anes PTL Lv  3 Current           2 Term 05/18/15 [redacted]w[redacted]d 07:36 / 00:20 3895 g F Vag-Spont EPI  LIV     Birth Comments: No anomalies noted  1 Term 10/27/12 [redacted]w[redacted]d 17:05 / 01:00 3997 g F Vag-Spont EPI  LIV    Past Surgical History: Past Surgical History:  Procedure Laterality Date  . TYMPANOSTOMY TUBE PLACEMENT Bilateral     Family History: No family history on file.  Social History: Social History   Tobacco Use  . Smoking status: Former Smoker    Last attempt to quit: 03/18/2014    Years since quitting: 4.7  . Smokeless tobacco: Former Systems developer    Quit date: 01/19/2012  Substance Use Topics  . Alcohol use: No  . Drug use: No    Allergies: No Known Allergies  Meds:  Medications Prior to Admission  Medication Sig Dispense Refill Last Dose  . acetaminophen (TYLENOL) 500 MG tablet Take  1,000 mg by mouth every 6 (six) hours as needed for mild pain.   Past Week at Unknown time  . ibuprofen (ADVIL,MOTRIN) 600 MG tablet Take 1 tablet (600 mg total) by mouth every 6 (six) hours as needed. 30 tablet 0   . oxyCODONE-acetaminophen (PERCOCET/ROXICET) 5-325 MG per tablet Take 1-2 tablets by mouth every 6 (six) hours as needed (for pain scale 4-7). 30 tablet 0   . Prenatal Vit-Fe Fumarate-FA (PRENATAL MULTIVITAMIN) TABS Take 1 tablet by mouth at bedtime.   05/17/2015 at Unknown time    ROS:  Review of Systems  Constitutional: Negative for chills, fatigue and fever.  HENT: Positive for congestion, rhinorrhea and sinus pressure.   Respiratory: Positive for shortness of breath.   Cardiovascular: Negative for chest pain.  Gastrointestinal: Negative for abdominal pain.  Genitourinary: Negative for difficulty urinating, dysuria, flank pain, pelvic pain, vaginal bleeding, vaginal discharge and vaginal pain.  Neurological: Negative for dizziness and headaches.  Psychiatric/Behavioral: Negative.      I have reviewed patient's Past Medical Hx, Surgical Hx, Family Hx, Social Hx, medications and allergies.   Physical Exam   Patient Vitals for the past 24 hrs:  BP Temp Pulse Resp SpO2  12/12/18 1830 122/77 - 99 - -  12/12/18 1806 (!) 152/92 99 F (37.2 C)  100 16 100 %   Constitutional: Well-developed, well-nourished female in no acute distress.  HEART: normal rate, heart sounds, regular rhythm RESP: normal effort, lung sounds clear and equal bilaterally GI: Abd soft, non-tender, gravid appropriate for gestational age.  MS: Extremities nontender, no edema, normal ROM Neurologic: Alert and oriented x 4.  GU: Neg CVAT.  PELVIC EXAM: Cervix pink, visually closed, without lesion, scant white creamy discharge, vaginal walls and external genitalia normal Bimanual exam: Cervix 0/long/high, firm, anterior, neg CMT, uterus nontender, nonenlarged, adnexa without tenderness, enlargement, or  mass     FHT:  Baseline 120 , moderate variability, accelerations present, no decelerations Contractions: None on toco or to palpation   Labs: Results for orders placed or performed during the hospital encounter of 12/12/18 (from the past 24 hour(s))  Urinalysis, Routine w reflex microscopic     Status: Abnormal   Collection Time: 12/12/18  5:30 PM  Result Value Ref Range   Color, Urine COLORLESS (A) YELLOW   APPearance CLEAR CLEAR   Specific Gravity, Urine 1.003 (L) 1.005 - 1.030   pH 6.0 5.0 - 8.0   Glucose, UA NEGATIVE NEGATIVE mg/dL   Hgb urine dipstick NEGATIVE NEGATIVE   Bilirubin Urine NEGATIVE NEGATIVE   Ketones, ur 20 (A) NEGATIVE mg/dL   Protein, ur NEGATIVE NEGATIVE mg/dL   Nitrite NEGATIVE NEGATIVE   Leukocytes, UA NEGATIVE NEGATIVE      Imaging:  Dg Chest 2 View  Result Date: 12/12/2018 CLINICAL DATA:  Shortness of breath and cough EXAM: CHEST - 2 VIEW COMPARISON:  None. FINDINGS: Cardiac shadow is within normal limits. The lungs are well aerated bilaterally. Patchy atelectatic changes are noted in the bases bilaterally without focal confluent infiltrate. No sizable effusion is seen. No bony abnormality is noted. IMPRESSION: Patchy bibasilar atelectasis. Electronically Signed   By: Inez Catalina M.D.   On: 12/12/2018 19:28    MAU Course/MDM: I have ordered labs and reviewed results.  NST reviewed and reactive Duoneb breathing treatment from respiratory, Sudafed, and CXR in MAU Pt O2 saturation 100% on RA, lung sounds wnl No reported improvement after breathing treatment CXR with mild patchy atelectasis, no evidence of pneumonia Consult Dr Ihor Dow with presentation, exam findings and test results.  D/C home with Z-pak and Mucinex, return for worsening symptoms Pt pharmacy closed so first dose of both given in MAU prior to discharge Pt discharge with strict return precautions.   Assessment: 1. Bacterial upper respiratory infection   2. Shortness of  breath   3. Upper respiratory infection   4. [redacted] weeks gestation of pregnancy     Plan: Discharge home Labor precautions and fetal kick counts Follow-up Information    Marylynn Pearson, MD Follow up.   Specialty:  Obstetrics and Gynecology Why:  As scheduled 12/19/17. Return to MAU with worsening symptoms or emergencies. Contact information: [address], [address]  [place] [address] [phone]          Allergies as of 12/12/2018   No Known Allergies     Medication List    STOP taking these medications   ibuprofen 600 MG tablet Commonly known as:  ADVIL,MOTRIN   oxyCODONE-acetaminophen 5-325 MG tablet Commonly known as:  PERCOCET/ROXICET     TAKE these medications   acetaminophen 500 MG tablet Commonly known as:  TYLENOL Take 1,000 mg by mouth every 6 (six) hours as needed for mild pain.   azithromycin 250 MG tablet Commonly known as:  ZITHROMAX Your first dose was given at  [facility]. Now take one tablet daily for 4 days.   prenatal multivitamin Tabs tablet Take 1 tablet by mouth at bedtime.       Fatima Blank Certified Nurse-Midwife 12/12/2018 8:15 PM

## 2018-12-12 NOTE — MAU Note (Signed)
Pt presents to MAU with complaints of cough, shortness of breath for a couple of weeks, Was evaluated at urgent care and was sent here for a chest xray, states they did not have proper equipment for xray there

## 2019-02-03 ENCOUNTER — Telehealth (HOSPITAL_COMMUNITY): Payer: Self-pay | Admitting: *Deleted

## 2019-02-03 ENCOUNTER — Encounter (HOSPITAL_COMMUNITY): Payer: Self-pay | Admitting: *Deleted

## 2019-02-03 NOTE — Telephone Encounter (Signed)
Preadmission screen  

## 2019-02-07 ENCOUNTER — Inpatient Hospital Stay (HOSPITAL_COMMUNITY): Payer: BLUE CROSS/BLUE SHIELD | Admitting: Anesthesiology

## 2019-02-07 ENCOUNTER — Inpatient Hospital Stay (HOSPITAL_COMMUNITY)
Admission: AD | Admit: 2019-02-07 | Discharge: 2019-02-08 | DRG: 807 | Disposition: A | Discharge status: Home / Self Care | Payer: BLUE CROSS/BLUE SHIELD | Attending: Obstetrics and Gynecology | Admitting: Obstetrics and Gynecology

## 2019-02-07 ENCOUNTER — Other Ambulatory Visit: Payer: Self-pay

## 2019-02-07 ENCOUNTER — Encounter (HOSPITAL_COMMUNITY): Payer: Self-pay

## 2019-02-07 DIAGNOSIS — Z3483 Encounter for supervision of other normal pregnancy, third trimester: Secondary | ICD-10-CM | POA: Diagnosis present

## 2019-02-07 DIAGNOSIS — O99214 Obesity complicating childbirth: Principal | ICD-10-CM | POA: Diagnosis present

## 2019-02-07 DIAGNOSIS — Z3490 Encounter for supervision of normal pregnancy, unspecified, unspecified trimester: Secondary | ICD-10-CM

## 2019-02-07 DIAGNOSIS — Z87891 Personal history of nicotine dependence: Secondary | ICD-10-CM

## 2019-02-07 DIAGNOSIS — Z3A40 40 weeks gestation of pregnancy: Secondary | ICD-10-CM

## 2019-02-07 LAB — CBC
HCT: 40.1 % (ref 36.0–46.0)
Hemoglobin: 13 g/dL (ref 12.0–15.0)
MCH: 29.9 pg (ref 26.0–34.0)
MCHC: 32.4 g/dL (ref 30.0–36.0)
MCV: 92.2 fL (ref 80.0–100.0)
Platelets: 294 10*3/uL (ref 150–400)
RBC: 4.35 MIL/uL (ref 3.87–5.11)
RDW: 14.6 % (ref 11.5–15.5)
WBC: 12.7 10*3/uL — ABNORMAL HIGH (ref 4.0–10.5)
nRBC: 0 % (ref 0.0–0.2)

## 2019-02-07 LAB — RPR: RPR Ser Ql: NONREACTIVE

## 2019-02-07 LAB — TYPE AND SCREEN
ABO/RH(D): A POS
Antibody Screen: NEGATIVE

## 2019-02-07 MED ORDER — TETANUS-DIPHTH-ACELL PERTUSSIS 5-2.5-18.5 LF-MCG/0.5 IM SUSP: 0.5000 mL | Freq: Once | INTRAMUSCULAR | Status: DC

## 2019-02-07 MED ORDER — SIMETHICONE 80 MG PO CHEW: 80.0000 mg | CHEWABLE_TABLET | ORAL | Status: DC | PRN

## 2019-02-07 MED ORDER — WITCH HAZEL-GLYCERIN EX PADS: 1.0000 "application " | MEDICATED_PAD | CUTANEOUS | Status: DC | PRN

## 2019-02-07 MED ORDER — ACETAMINOPHEN 325 MG PO TABS: 650.0000 mg | ORAL_TABLET | ORAL | Status: DC | PRN

## 2019-02-07 MED ORDER — LACTATED RINGERS IV SOLN
500.0000 mL | Freq: Once | INTRAVENOUS | Status: AC
  Administered 2019-02-07: 500 mL via INTRAVENOUS

## 2019-02-07 MED ORDER — OXYCODONE-ACETAMINOPHEN 5-325 MG PO TABS: 1.0000 | ORAL_TABLET | ORAL | Status: DC | PRN

## 2019-02-07 MED ORDER — TERBUTALINE SULFATE 1 MG/ML IJ SOLN
0.2500 mg | Freq: Once | INTRAMUSCULAR | Status: DC | PRN
  Filled 2019-02-07: qty 1

## 2019-02-07 MED ORDER — DIPHENHYDRAMINE HCL 50 MG/ML IJ SOLN: 12.5000 mg | INTRAMUSCULAR | Status: DC | PRN

## 2019-02-07 MED ORDER — DIPHENHYDRAMINE HCL 25 MG PO CAPS: 25.0000 mg | ORAL_CAPSULE | Freq: Four times a day (QID) | ORAL | Status: DC | PRN

## 2019-02-07 MED ORDER — LIDOCAINE-EPINEPHRINE (PF) 2 %-1:200000 IJ SOLN
INTRAMUSCULAR | Status: DC | PRN
  Administered 2019-02-07: 3 mL via EPIDURAL
  Administered 2019-02-07: 4 mL via EPIDURAL

## 2019-02-07 MED ORDER — BENZOCAINE-MENTHOL 20-0.5 % EX AERO: 1.0000 "application " | INHALATION_SPRAY | CUTANEOUS | Status: DC | PRN

## 2019-02-07 MED ORDER — FLEET ENEMA 7-19 GM/118ML RE ENEM: 1.0000 | ENEMA | RECTAL | Status: DC | PRN

## 2019-02-07 MED ORDER — IBUPROFEN 600 MG PO TABS
600.0000 mg | ORAL_TABLET | Freq: Four times a day (QID) | ORAL | Status: DC
  Administered 2019-02-07 – 2019-02-08 (×3): 600 mg via ORAL
  Filled 2019-02-07 (×3): qty 1

## 2019-02-07 MED ORDER — DIBUCAINE 1 % RE OINT: 1.0000 "application " | TOPICAL_OINTMENT | RECTAL | Status: DC | PRN

## 2019-02-07 MED ORDER — OXYTOCIN 40 UNITS IN NORMAL SALINE INFUSION - SIMPLE MED
2.5000 [IU]/h | INTRAVENOUS | Status: DC
  Filled 2019-02-07: qty 1000

## 2019-02-07 MED ORDER — PHENYLEPHRINE 40 MCG/ML (10ML) SYRINGE FOR IV PUSH (FOR BLOOD PRESSURE SUPPORT)
80.0000 ug | PREFILLED_SYRINGE | INTRAVENOUS | Status: DC | PRN
  Administered 2019-02-07: 80 ug via INTRAVENOUS
  Filled 2019-02-07: qty 10

## 2019-02-07 MED ORDER — FENTANYL-BUPIVACAINE-NACL 0.5-0.125-0.9 MG/250ML-% EP SOLN
12.0000 mL/h | EPIDURAL | Status: DC | PRN
  Filled 2019-02-07: qty 250

## 2019-02-07 MED ORDER — ONDANSETRON HCL 4 MG PO TABS: 4.0000 mg | ORAL_TABLET | ORAL | Status: DC | PRN

## 2019-02-07 MED ORDER — PRENATAL MULTIVITAMIN CH: 1.0000 | ORAL_TABLET | Freq: Every day | ORAL | Status: DC

## 2019-02-07 MED ORDER — SODIUM CHLORIDE (PF) 0.9 % IJ SOLN
INTRAMUSCULAR | Status: DC | PRN
  Administered 2019-02-07: 12 mL/h via EPIDURAL

## 2019-02-07 MED ORDER — ONDANSETRON HCL 4 MG/2ML IJ SOLN: 4.0000 mg | INTRAMUSCULAR | Status: DC | PRN

## 2019-02-07 MED ORDER — ONDANSETRON HCL 4 MG/2ML IJ SOLN: 4.0000 mg | Freq: Four times a day (QID) | INTRAMUSCULAR | Status: DC | PRN

## 2019-02-07 MED ORDER — MEDROXYPROGESTERONE ACETATE 150 MG/ML IM SUSP: 150.0000 mg | INTRAMUSCULAR | Status: DC | PRN

## 2019-02-07 MED ORDER — LIDOCAINE HCL (PF) 1 % IJ SOLN
30.0000 mL | INTRAMUSCULAR | Status: DC | PRN
  Filled 2019-02-07: qty 30

## 2019-02-07 MED ORDER — OXYTOCIN BOLUS FROM INFUSION
500.0000 mL | Freq: Once | INTRAVENOUS | Status: AC
  Administered 2019-02-07: 500 mL via INTRAVENOUS

## 2019-02-07 MED ORDER — EPHEDRINE 5 MG/ML INJ
10.0000 mg | INTRAVENOUS | Status: DC | PRN
  Filled 2019-02-07: qty 2

## 2019-02-07 MED ORDER — OXYCODONE-ACETAMINOPHEN 5-325 MG PO TABS: 2.0000 | ORAL_TABLET | ORAL | Status: DC | PRN

## 2019-02-07 MED ORDER — SENNOSIDES-DOCUSATE SODIUM 8.6-50 MG PO TABS
2.0000 | ORAL_TABLET | ORAL | Status: DC
  Filled 2019-02-07: qty 2

## 2019-02-07 MED ORDER — MEASLES, MUMPS & RUBELLA VAC IJ SOLR
0.5000 mL | Freq: Once | INTRAMUSCULAR | Status: DC
  Filled 2019-02-07: qty 0.5

## 2019-02-07 MED ORDER — BUTORPHANOL TARTRATE 1 MG/ML IJ SOLN: 1.0000 mg | INTRAMUSCULAR | Status: DC | PRN

## 2019-02-07 MED ORDER — COCONUT OIL OIL
1.0000 "application " | TOPICAL_OIL | Status: DC | PRN
  Administered 2019-02-08: 1 via TOPICAL
  Filled 2019-02-07: qty 120

## 2019-02-07 MED ORDER — SOD CITRATE-CITRIC ACID 500-334 MG/5ML PO SOLN
30.0000 mL | ORAL | Status: DC | PRN
  Filled 2019-02-07: qty 30

## 2019-02-07 MED ORDER — LACTATED RINGERS IV SOLN
INTRAVENOUS | Status: DC
  Administered 2019-02-07 (×2): via INTRAVENOUS

## 2019-02-07 MED ORDER — LACTATED RINGERS IV SOLN: 500.0000 mL | INTRAVENOUS | Status: DC | PRN

## 2019-02-07 MED ORDER — PHENYLEPHRINE 40 MCG/ML (10ML) SYRINGE FOR IV PUSH (FOR BLOOD PRESSURE SUPPORT)
80.0000 ug | PREFILLED_SYRINGE | INTRAVENOUS | Status: DC | PRN
  Filled 2019-02-07 (×2): qty 10

## 2019-02-07 MED ORDER — OXYTOCIN 40 UNITS IN NORMAL SALINE INFUSION - SIMPLE MED
1.0000 m[IU]/min | INTRAVENOUS | Status: DC
  Administered 2019-02-07: 2 m[IU]/min via INTRAVENOUS

## 2019-02-07 MED ORDER — ACETAMINOPHEN 325 MG PO TABS
650.0000 mg | ORAL_TABLET | ORAL | Status: DC | PRN
  Administered 2019-02-08: 650 mg via ORAL
  Filled 2019-02-07: qty 2

## 2019-02-07 NOTE — MAU Note (Signed)
Contractions every 4-5 mins for the past 2 hours.  Possible SROM? Put on a panty liner 1 hour ago and states it feels like it's wet.  No VB.  + FM.  GBS negative.  Planning epidural.  Hx of PPH with second pregnancy w/o transfusion.

## 2019-02-07 NOTE — Anesthesia Postprocedure Evaluation (Signed)
Anesthesia Post Note  Patient: Tonya Chan  Procedure(s) Performed: AN AD HOC LABOR EPIDURAL     Patient location during evaluation: Mother Baby Anesthesia Type: Epidural Level of consciousness: awake and alert Pain management: pain level controlled Vital Signs Assessment: post-procedure vital signs reviewed and stable Respiratory status: spontaneous breathing, nonlabored ventilation and respiratory function stable Cardiovascular status: stable Postop Assessment: no headache, no backache, epidural receding, no apparent nausea or vomiting, patient able to bend at knees, adequate PO intake and able to ambulate Anesthetic complications: no    Last Vitals:  Vitals:   02/07/19 1347 02/07/19 1400  BP: 115/62 124/64  Pulse: 97 98  Resp:  18  Temp:  36.7 C  SpO2:  99%    Last Pain:  Vitals:   02/07/19 1400  TempSrc: Oral  PainSc: 0-No pain   Pain Goal:                   [employer]

## 2019-02-07 NOTE — Progress Notes (Signed)
SVD of vigorous female infant w/ apgars of 9,9.  Placenta delivered spontaneous w/ 3VC.   1st degree lac repaired w/ 3-0 vicryl rapide.  Fundus firm.  Mom and baby skin to skin

## 2019-02-07 NOTE — Anesthesia Preprocedure Evaluation (Signed)
Anesthesia Evaluation  Patient identified by MRN, date of birth, ID band Patient awake    Reviewed: Allergy & Precautions, NPO status , Patient's Chart, lab work & pertinent test results  Airway Mallampati: II  TM Distance: >3 FB Neck ROM: Full    Dental no notable dental hx.    Pulmonary neg pulmonary ROS, former smoker,    Pulmonary exam normal breath sounds clear to auscultation       Cardiovascular negative cardio ROS Normal cardiovascular exam Rhythm:Regular Rate:Normal     Neuro/Psych negative neurological ROS  negative psych ROS   GI/Hepatic negative GI ROS, Neg liver ROS,   Endo/Other  Morbid obesity  Renal/GU negative Renal ROS  negative genitourinary   Musculoskeletal negative musculoskeletal ROS (+)   Abdominal   Peds  Hematology negative hematology ROS (+)   Anesthesia Other Findings   Reproductive/Obstetrics (+) Pregnancy                             Anesthesia Physical Anesthesia Plan  ASA: III  Anesthesia Plan: Epidural   Post-op Pain Management:    Induction:   PONV Risk Score and Plan: Treatment may vary due to age or medical condition  Airway Management Planned: Natural Airway  Additional Equipment:   Intra-op Plan:   Post-operative Plan:   Informed Consent: I have reviewed the patients History and Physical, chart, labs and discussed the procedure including the risks, benefits and alternatives for the proposed anesthesia with the patient or authorized representative who has indicated his/her understanding and acceptance.       Plan Discussed with: Anesthesiologist  Anesthesia Plan Comments: (Patient identified. Risks, benefits, options discussed with patient including but not limited to bleeding, infection, nerve damage, paralysis, failed block, incomplete pain control, headache, blood pressure changes, nausea, vomiting, reactions to medication,  itching, and post partum back pain. Confirmed with bedside nurse the patient's most recent platelet count. Confirmed with the patient that they are not taking any anticoagulation, have any bleeding history or any family history of bleeding disorders. Patient expressed understanding and wishes to proceed. All questions were answered. )        Anesthesia Quick Evaluation

## 2019-02-07 NOTE — Anesthesia Pain Management Evaluation Note (Signed)
  CRNA Pain Management Visit Note  Patient: Tonya Chan, 33 y.o., female  "Hello I am a member of the anesthesia team at [facility]. We have an anesthesia team available at all times to provide care throughout the hospital, including epidural management and anesthesia for C-section. I don't know your plan for the delivery whether it a natural birth, water birth, IV sedation, nitrous supplementation, doula or epidural, but we want to meet your pain goals."   1.Was your pain managed to your expectations on prior hospitalizations?   Yes   2.What is your expectation for pain management during this hospitalization?     Epidural  3.How can we help you reach that goal? Epidural intact  Record the patient's initial score and the patient's pain goal.   Pain: 0  Pain Goal: 8 [facility] wants you to be able to say your pain was always managed very well.  Everette Rank 02/07/2019

## 2019-02-07 NOTE — Plan of Care (Signed)
  Problem: Education: Goal: Knowledge of General Education information will improve Description Including pain rating scale, medication(s)/side effects and non-pharmacologic comfort measures Outcome: Progressing   Problem: Health Behavior/Discharge Planning: Goal: Ability to manage health-related needs will improve Outcome: Progressing   Problem: Clinical Measurements: Goal: Ability to maintain clinical measurements within normal limits will improve Outcome: Progressing Goal: Will remain free from infection Outcome: Progressing Goal: Diagnostic test results will improve Outcome: Progressing Goal: Respiratory complications will improve Outcome: Progressing Goal: Cardiovascular complication will be avoided Outcome: Progressing   Problem: Activity: Goal: Risk for activity intolerance will decrease Outcome: Progressing   Problem: Nutrition: Goal: Adequate nutrition will be maintained Outcome: Progressing   Problem: Coping: Goal: Level of anxiety will decrease Outcome: Progressing   Problem: Elimination: Goal: Will not experience complications related to bowel motility Outcome: Progressing Goal: Will not experience complications related to urinary retention Outcome: Progressing   Problem: Pain Managment: Goal: General experience of comfort will improve Outcome: Progressing   Problem: Safety: Goal: Ability to remain free from injury will improve Outcome: Progressing   Problem: Skin Integrity: Goal: Risk for impaired skin integrity will decrease Outcome: Progressing   Problem: Education: Goal: Knowledge of Childbirth will improve Outcome: Progressing Goal: Ability to make informed decisions regarding treatment and plan of care will improve Outcome: Progressing Goal: Ability to state and carry out methods to decrease the pain will improve Outcome: Progressing   Problem: Coping: Goal: Ability to verbalize concerns and feelings about labor and delivery will  improve Outcome: Progressing   Problem: Life Cycle: Goal: Ability to make normal progression through stages of labor will improve Outcome: Progressing Goal: Ability to effectively push during vaginal delivery will improve Outcome: Progressing   Problem: Role Relationship: Goal: Ability to demonstrate positive interaction with the child will improve Outcome: Progressing   Problem: Safety: Goal: Risk of complications during labor and delivery will decrease Outcome: Progressing   Problem: Pain Management: Goal: Relief or control of pain from uterine contractions will improve Outcome: Progressing

## 2019-02-07 NOTE — Lactation Note (Signed)
This note was copied from a baby's chart. Lactation Consultation Note  Patient Name: Tonya Chan [id number] Date: 02/07/2019 Reason for consult: Initial assessment;Term;Difficult latch  P3 mother whose infant is now 3 hours old.  Mother has breast fed her other 2 children (not 48 and 34 years old).  She breast fed her last child for 14 months.  Mother had recently breast fed for 40 minutes but baby was fussy and crying when I arrived.  Offered to assist with latching and mother accepted.  Mother's breasts are large, soft and non tender and nipples are large, everted but short shafted.  Breast tissue is compressible.  Mother already has bruised areas and reddened nipples from prior poor latching.  Discussed the importance of a proper, deep latch to help avoid future pain with latching.  Educated on the importance of using EBM to rub into nipples/areolas for comfort; also provided comfort gels with instructions for use.  Baby would not calm down enough to latch.  Suggested mother do some hand expression and I finger fed multiple drops to baby.  After she calmed down I assisted to latch in the football hold on the left side.  Baby had a wide gape and flanged lips with her latch.  With gentle stimulation she was able to begin sucking rhythmically.  She sucked in short bursts before requiring more gentle stimulation.  Observed her feeding on/off for 15 minutes, but it was not a good sustained latch.  Since baby is able to latch I will not introduce breast shells at this time.  Mother knows that they are an option.  Baby fell asleep and I suggested mother do STS and watch for feeding cues.  Mother will call for latch assistance as needed.  Colostrum container provided and milk storage times reviewed.  Mother will hand express before/after feedings and feed back any EBM she obtains to baby.    RN updated.  Mom made aware of O/P services, breastfeeding support groups, community resources, and our phone #  for post-discharge questions.   Maternal Data Formula Feeding for Exclusion: No Has patient been taught Hand Expression?: Yes Does the patient have breastfeeding experience prior to this delivery?: Yes  Feeding Feeding Type: Breast Fed  LATCH Score Latch: Repeated attempts needed to sustain latch, nipple held in mouth throughout feeding, stimulation needed to elicit sucking reflex.  Audible Swallowing: None  Type of Nipple: Everted at rest and after stimulation(short shafted bilaterally)  Comfort (Breast/Nipple): Soft / non-tender  Hold (Positioning): Assistance needed to correctly position infant at breast and maintain latch.  LATCH Score: 6  Interventions Interventions: Breast feeding basics reviewed;Assisted with latch;Skin to skin;Breast massage;Hand express;Breast compression;Expressed milk;Position options;Support pillows;Adjust position  Lactation Tools Discussed/Used     Consult Status Consult Status: Follow-up Date: 02/08/19 Follow-up type: In-patient    Little Ishikawa 02/07/2019, 4:27 PM

## 2019-02-07 NOTE — H&P (Signed)
Tonya Chan is a 33 y.o. female presenting for SOL.  Pregnancy uncomplicated.  She got epidural overnight.    OB History    Gravida  4   Para  2   Term  2   Preterm  0   AB  1   Living  2     SAB  1   TAB  0   Ectopic  0   Multiple  0   Live Births  2          Past Medical History:  Diagnosis Date  . No pertinent past medical history    Past Surgical History:  Procedure Laterality Date  . TYMPANOSTOMY TUBE PLACEMENT Bilateral    Family History: family history includes Bladder Cancer in her maternal grandfather; Breast cancer in her maternal grandmother; Colon cancer in her maternal grandmother; Diabetes in her maternal grandfather and maternal uncle; Heart disease in her maternal grandfather; Stroke in her paternal grandfather; Thyroid disease in her maternal grandmother. Social History:  reports that she quit smoking about 4 years ago. She quit smokeless tobacco use about 7 years ago. She reports that she does not drink alcohol or use drugs.     Maternal Diabetes: No Genetic Screening: Declined Maternal Ultrasounds/Referrals: Normal Fetal Ultrasounds or other Referrals:  None Maternal Substance Abuse:  No Significant Maternal Medications:  None Significant Maternal Lab Results:  None Other Comments:  None  ROS History Dilation: 5 Effacement (%): 80 Station: -3 Exam by:: (P) Dr. Julien Girt Blood pressure (!) 109/54, pulse 97, temperature 97.6 F (36.4 C), temperature source Oral, resp. rate 18, height 5\' 1"  (1.549 m), weight 109.4 kg, last menstrual period 05/03/2018, SpO2 96 %, unknown if currently breastfeeding. Exam Physical Exam  Gen - NAD Abd - gravid, NT Ext - NT, no edema Cvx 2cm AROM - clear Prenatal labs: ABO, Rh: --/--/A POS (02/21 0316) Antibody: NEG (02/21 0316) Rubella: Immune (07/23 0000) RPR: Nonreactive (07/23 0000)  HBsAg: Negative (07/23 0000)  HIV: Non-reactive (07/23 0000)  GBS:     Assessment/Plan: Admit  Cervix  unchanged - will augment w/ low dose pitocin Exp mngt Marylynn Pearson 02/07/2019, 7:57 AM

## 2019-02-07 NOTE — Anesthesia Procedure Notes (Signed)
Epidural Patient location during procedure: OB Start time: 02/07/2019 4:55 AM End time: 02/07/2019 5:10 AM  Staffing Anesthesiologist: Freddrick March, MD Performed: anesthesiologist   Preanesthetic Checklist Completed: patient identified, pre-op evaluation, timeout performed, IV checked, risks and benefits discussed and monitors and equipment checked  Epidural Patient position: sitting Prep: site prepped and draped and DuraPrep Patient monitoring: continuous pulse ox, blood pressure, heart rate and cardiac monitor Approach: midline Location: L3-L4 Injection technique: LOR air  Needle:  Needle type: Tuohy  Needle gauge: 17 G Needle length: 9 cm Needle insertion depth: 6 cm Catheter type: closed end flexible Catheter size: 19 Gauge Catheter at skin depth: 13 cm Test dose: negative  Assessment Sensory level: T8 Events: blood not aspirated, injection not painful, no injection resistance, negative IV test and no paresthesia  Additional Notes Patient identified. Risks/Benefits/Options discussed with patient including but not limited to bleeding, infection, nerve damage, paralysis, failed block, incomplete pain control, headache, blood pressure changes, nausea, vomiting, reactions to medication both or allergic, itching and postpartum back pain. Confirmed with bedside nurse the patient's most recent platelet count. Confirmed with patient that they are not currently taking any anticoagulation, have any bleeding history or any family history of bleeding disorders. Patient expressed understanding and wished to proceed. All questions were answered. Sterile technique was used throughout the entire procedure. Please see nursing notes for vital signs. Test dose was given through epidural catheter and negative prior to continuing to dose epidural or start infusion. Warning signs of high block given to the patient including shortness of breath, tingling/numbness in hands, complete motor block,  or any concerning symptoms with instructions to call for help. Patient was given instructions on fall risk and not to get out of bed. All questions and concerns addressed with instructions to call with any issues or inadequate analgesia.  Reason for block:procedure for pain

## 2019-02-08 LAB — CBC
HCT: 36.2 % (ref 36.0–46.0)
Hemoglobin: 11.4 g/dL — ABNORMAL LOW (ref 12.0–15.0)
MCH: 29 pg (ref 26.0–34.0)
MCHC: 31.5 g/dL (ref 30.0–36.0)
MCV: 92.1 fL (ref 80.0–100.0)
Platelets: 240 10*3/uL (ref 150–400)
RBC: 3.93 MIL/uL (ref 3.87–5.11)
RDW: 14.8 % (ref 11.5–15.5)
WBC: 10.7 10*3/uL — ABNORMAL HIGH (ref 4.0–10.5)
nRBC: 0 % (ref 0.0–0.2)

## 2019-02-08 NOTE — Lactation Note (Signed)
This note was copied from a baby's chart. Lactation Consultation Note  Patient Name: Tonya Chan [id number] Date: 02/08/2019 Reason for consult: Follow-up assessment Baby is 18 hours old.  Mom feels baby is latching well.  She is a little sore from earlier shallow latches.  Observed mom latch baby in cross cradle hold.  Latch good and mom has good technique.  Mom comfortable after initial latch on pain. Discussed milk coming to volume.  She states she had no engorgement with previous babies.  Mom will use expressed milk and coconut oil on nipples. Lactation outpatient services and support reviewed and encouraged prn.  Maternal Data    Feeding Feeding Type: Breast Fed  LATCH Score Latch: Grasps breast easily, tongue down, lips flanged, rhythmical sucking.  Audible Swallowing: A few with stimulation  Type of Nipple: Everted at rest and after stimulation  Comfort (Breast/Nipple): Filling, red/small blisters or bruises, mild/mod discomfort  Hold (Positioning): No assistance needed to correctly position infant at breast.  LATCH Score: 8  Interventions    Lactation Tools Discussed/Used     Consult Status Consult Status: Complete Follow-up type: Call as needed    [name] 02/08/2019, 12:20 PM

## 2019-02-08 NOTE — Progress Notes (Signed)
Left VM that Dr. Julien Girt made note she requested.

## 2019-02-08 NOTE — Progress Notes (Signed)
GBS neg Collected 01/15/19

## 2019-02-08 NOTE — Discharge Summary (Signed)
Obstetric Discharge Summary Reason for Admission: onset of labor Prenatal Procedures: ultrasound Intrapartum Procedures: spontaneous vaginal delivery Postpartum Procedures: none Complications-Operative and Postpartum: none Hemoglobin  Date Value Ref Range Status  02/08/2019 11.4 (L) 12.0 - 15.0 g/dL Final   HCT  Date Value Ref Range Status  02/08/2019 36.2 36.0 - 46.0 % Final    Physical Exam:  General: alert and cooperative Lochia: appropriate Uterine Fundus: firm Incision: healing well, no significant drainage DVT Evaluation: No evidence of DVT seen on physical exam.  Discharge Diagnoses: Term Pregnancy-delivered  Discharge Information: Date: 02/08/2019 Activity: pelvic rest Diet: routine Medications: PNV and Ibuprofen Condition: stable Instructions: refer to practice specific booklet Discharge to: home   Newborn Data: Live born female  Birth Weight: 7 lb 15 oz (3600 g) APGAR: 88, 9  Newborn Delivery   Birth date/time:  02/07/2019 12:20:00 Delivery type:  Vaginal, Spontaneous     Home with mother.  Marylynn Pearson 02/08/2019, 8:58 AM

## 2019-02-12 ENCOUNTER — Inpatient Hospital Stay (HOSPITAL_COMMUNITY): Admission: RE | Admit: 2019-02-12 | Payer: BLUE CROSS/BLUE SHIELD | Source: Ambulatory Visit

## 2019-02-22 ENCOUNTER — Ambulatory Visit: Payer: Self-pay

## 2019-02-22 NOTE — Lactation Note (Signed)
This note was copied from a baby's chart. Pedis Lactation Note: initial assessment / per Dr. Earle Gell Linus Mako obtain report ).  As LC's visited mom in the baby's room -[id number] / baby is on droplet and contact precautions.   mom holding baby and baby in and off of sleep.  Per  Mom last fed at 2:30 pm - 30 ml and at at 3:15 pm 30 ml.  Per mom has been pumping every 3-4 hours with 5 1/2 oz consistently. ( Milk supply isn't a challenge). Baby was gaining steadily and then the baby got sick 7 days ago and then started dropping weight.  Baby is a patient of [place].  Mom is an experienced breast feeder / P3/ and due to her volume being so high the baby may not be consistently  Getting the hind milk / also being ill with congestion and persistent cough/ and has been spitty with feedings due to coughing.  LC 's unable to assess latch at this consult due to the baby recently fed.   For now the [facility] plan is : ( discussed with mom and dad )  Breast feed at the breast for 15 -20 mins max/ and  EBM supplement ( fortified )  with 30 ml to start ( goal is 45- 60 ml and progressing To 75 ml) every 3 hours - per Peds Resident.   If there is a feeding due at 3 hours and the baby is sluggish to feed - feed the supplement 1st and then to the breast 2nd.  Due to congestion and coughing to change nipple from the Tommie Tippie to Nufant ( purple rim nipple )  Dr. Earle Gell aware to write and order for the use of the purple nipple and this [facility] plan discussed with Dr. Earle Gell and residents.   Post pump both breast and then the next feeding switch to the other breast  For mom plenty of naps/ fluids and calories.   Goal is to increase weight steadily . 10 % weight loss from birth.    Follow - up 3/8 from lactation.

## 2019-02-23 ENCOUNTER — Ambulatory Visit: Payer: Self-pay

## 2019-02-23 NOTE — Lactation Note (Signed)
This note was copied from a baby's chart. Lactation Consultation Note  Patient Name: Tonya Chan [id number] Date: 02/23/2019 Reason for consult: Follow-up assessment;Term;Other (Comment);Infant weight loss(readmit / gained weight since yesterday / [place] asked dad to mentioned to mom to have the RN call is she desires a latch assessment )  Baby is 36 weeks old.  LC called this am and spoke to the Peds RN caring for this baby and she mentioned the baby has picked up with the volume of milk per feeding compared to yesterday and weight.  LC also reviewed the doc flow sheets .  @ 2:30 pm called the baby's room and dad mentioned mom had just breast fed the baby and was starting the supplement.  Dad mentioned the baby is feeding so much better today with the purple nipple. Also the baby has been to the breast to feed.  LC recommended for dad to mention  to mom if she would like a feeding assessment by the [facility] to call.     Maternal Data    Feeding Feeding Type: (dad mentioned the baby had gone to breast 1st and is being supplemented with EBM / bottle )  LATCH Score                   Interventions Interventions: Breast feeding basics reviewed  Lactation Tools Discussed/Used     Consult Status Consult Status: PRN Date: (Peds readmitted ) Follow-up type: Other (comment)(Peds patient )    Myer Haff 02/23/2019, 3:30 PM

## 2020-09-21 DIAGNOSIS — N911 Secondary amenorrhea: Secondary | ICD-10-CM | POA: Diagnosis not present

## 2020-09-28 DIAGNOSIS — Z3480 Encounter for supervision of other normal pregnancy, unspecified trimester: Secondary | ICD-10-CM | POA: Diagnosis not present

## 2020-09-28 DIAGNOSIS — Z3481 Encounter for supervision of other normal pregnancy, first trimester: Secondary | ICD-10-CM | POA: Diagnosis not present

## 2020-09-28 DIAGNOSIS — Z3685 Encounter for antenatal screening for Streptococcus B: Secondary | ICD-10-CM | POA: Diagnosis not present

## 2020-09-28 LAB — OB RESULTS CONSOLE RUBELLA ANTIBODY, IGM: Rubella: IMMUNE

## 2020-09-28 LAB — OB RESULTS CONSOLE ANTIBODY SCREEN: Antibody Screen: NEGATIVE

## 2020-09-28 LAB — OB RESULTS CONSOLE ABO/RH: RH Type: POSITIVE

## 2020-09-28 LAB — OB RESULTS CONSOLE HIV ANTIBODY (ROUTINE TESTING): HIV: NONREACTIVE

## 2020-09-28 LAB — OB RESULTS CONSOLE HEPATITIS B SURFACE ANTIGEN: Hepatitis B Surface Ag: NEGATIVE

## 2020-09-28 LAB — OB RESULTS CONSOLE RPR: RPR: NONREACTIVE

## 2020-10-13 DIAGNOSIS — Z113 Encounter for screening for infections with a predominantly sexual mode of transmission: Secondary | ICD-10-CM | POA: Diagnosis not present

## 2020-10-13 DIAGNOSIS — Z3400 Encounter for supervision of normal first pregnancy, unspecified trimester: Secondary | ICD-10-CM | POA: Diagnosis not present

## 2020-10-13 DIAGNOSIS — Z331 Pregnant state, incidental: Secondary | ICD-10-CM | POA: Diagnosis not present

## 2020-10-13 DIAGNOSIS — Z124 Encounter for screening for malignant neoplasm of cervix: Secondary | ICD-10-CM | POA: Diagnosis not present

## 2020-10-18 LAB — HM PAP SMEAR

## 2020-10-28 DIAGNOSIS — Z3682 Encounter for antenatal screening for nuchal translucency: Secondary | ICD-10-CM | POA: Diagnosis not present

## 2020-10-28 DIAGNOSIS — Z3A12 12 weeks gestation of pregnancy: Secondary | ICD-10-CM | POA: Diagnosis not present

## 2020-11-08 DIAGNOSIS — Z23 Encounter for immunization: Secondary | ICD-10-CM | POA: Diagnosis not present

## 2020-11-25 IMAGING — CR DG CHEST 2V
2 series · 2 of 2 positions shown · non-contrast
Comparison: None.

CLINICAL DATA: Shortness of breath and cough

EXAM:
CHEST - 2 VIEW

[chest pa]
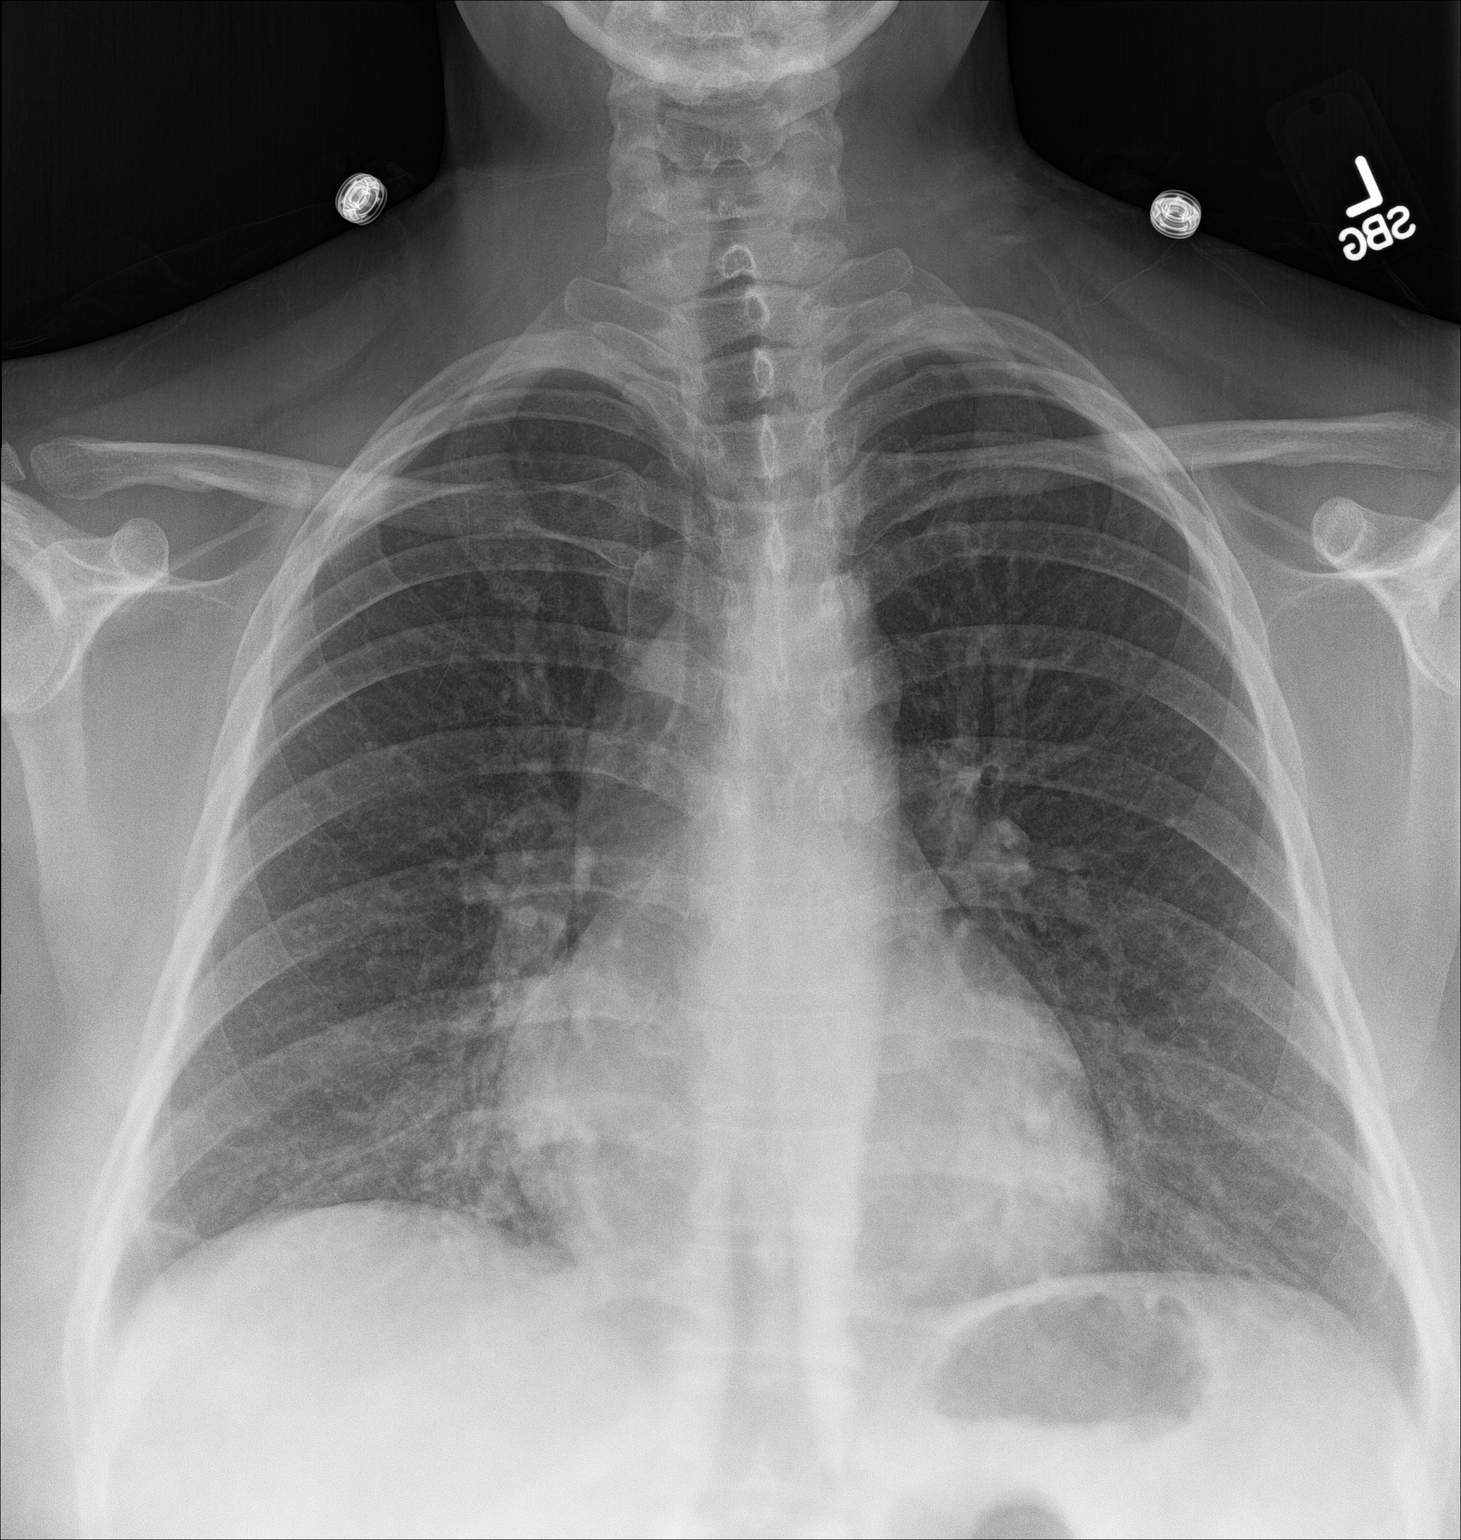

[chest lat]
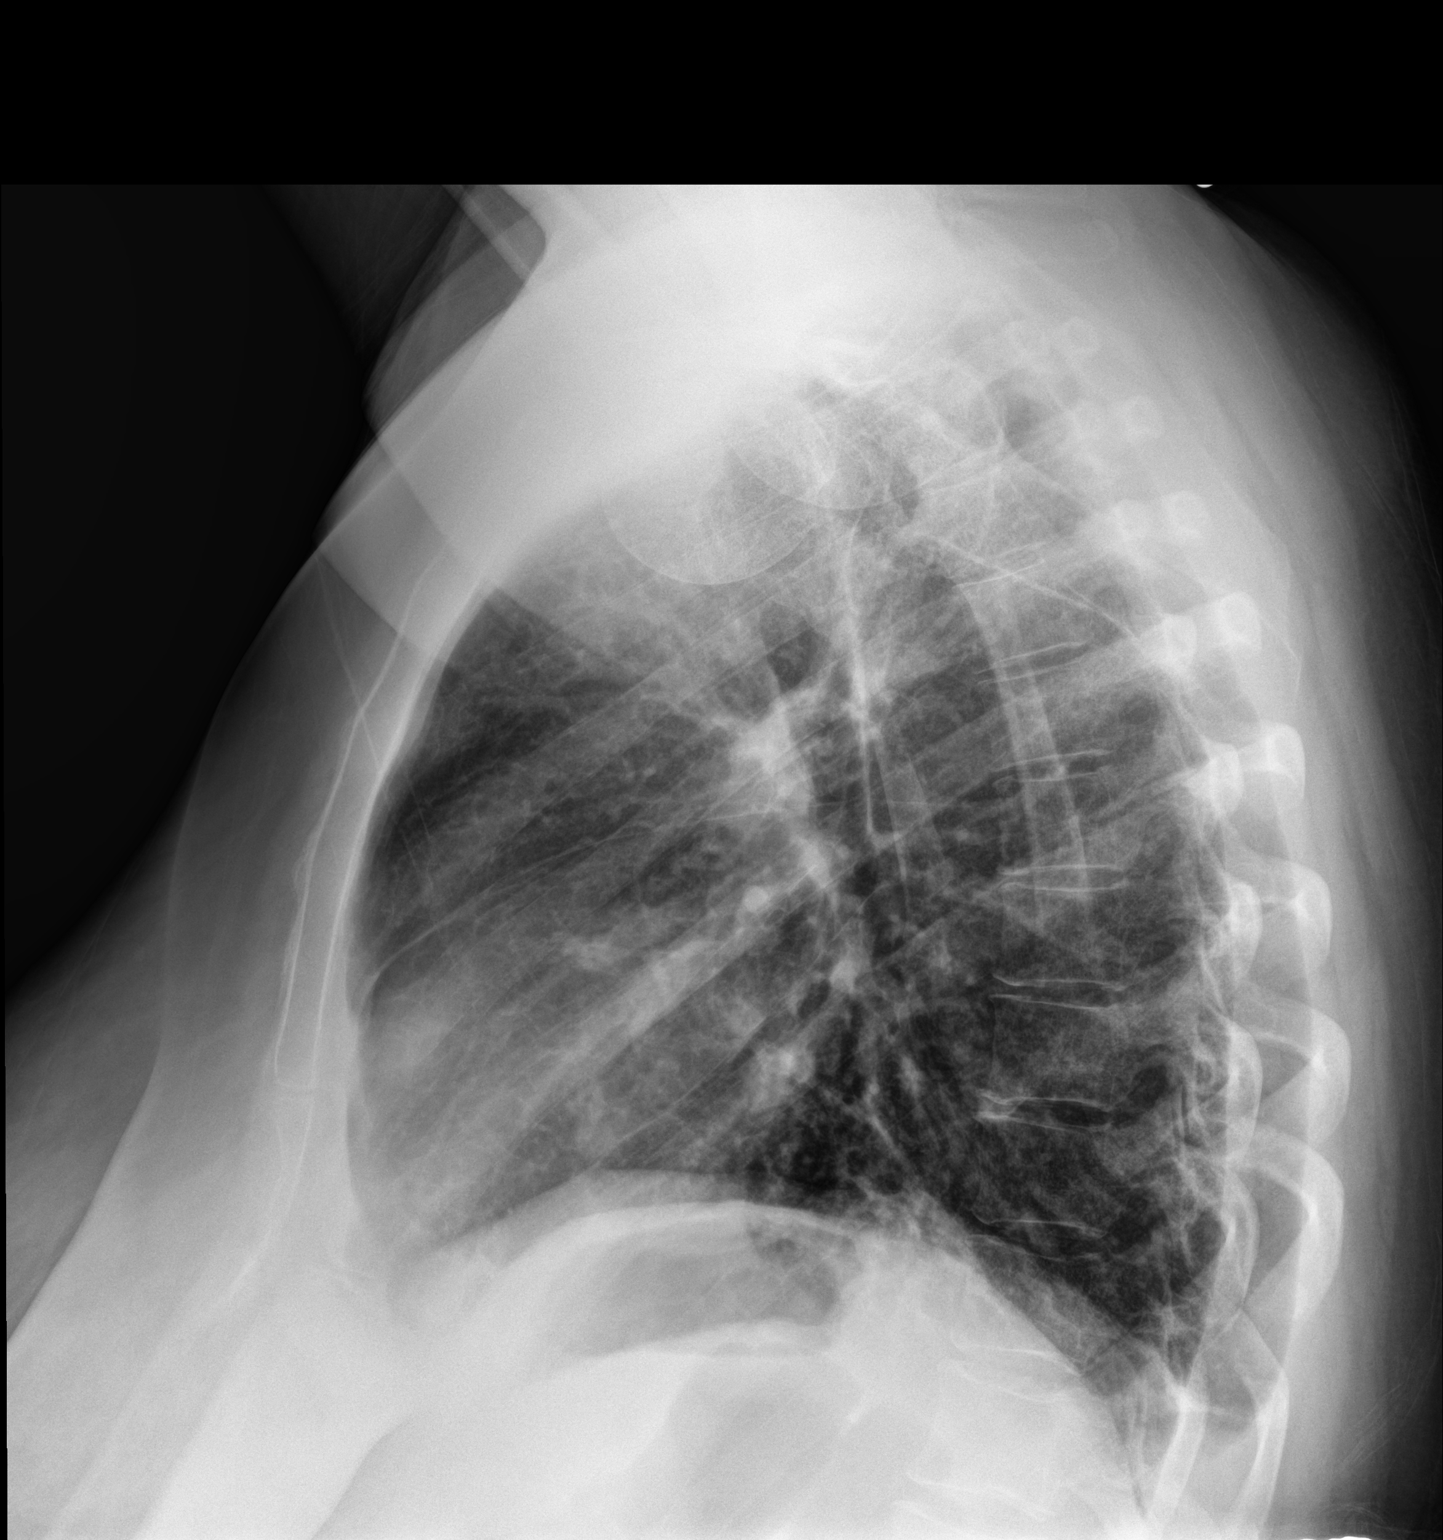

[2 of 2 positions shown; findings below may reference images not displayed]

FINDINGS: Cardiac shadow is within normal limits. The lungs are well aerated
bilaterally. Patchy atelectatic changes are noted in the bases
bilaterally without focal confluent infiltrate. No sizable effusion
is seen. No bony abnormality is noted.
IMPRESSION: Patchy bibasilar atelectasis.

## 2020-12-07 DIAGNOSIS — Z363 Encounter for antenatal screening for malformations: Secondary | ICD-10-CM | POA: Diagnosis not present

## 2020-12-07 DIAGNOSIS — Z361 Encounter for antenatal screening for raised alphafetoprotein level: Secondary | ICD-10-CM | POA: Diagnosis not present

## 2020-12-07 DIAGNOSIS — Z3480 Encounter for supervision of other normal pregnancy, unspecified trimester: Secondary | ICD-10-CM | POA: Diagnosis not present

## 2020-12-07 DIAGNOSIS — Z3A18 18 weeks gestation of pregnancy: Secondary | ICD-10-CM | POA: Diagnosis not present

## 2020-12-16 DIAGNOSIS — U071 COVID-19: Secondary | ICD-10-CM | POA: Diagnosis not present

## 2020-12-18 NOTE — L&D Delivery Note (Signed)
Delivery Note At 1:02 PM a viable female was delivered via  (Presentation:      ).  APGAR: 8 9  weight  pending.   Placenta status:  Spontaneously with 3 vessel cord  Cord:   with the following complications:  none.  Cord pH: not obtained  Anesthesia:  none Episiotomy:  no Lacerations: none  Suture Repair: 3.0 chromic Est. Blood Loss (mL):  300  Mom to postpartum.  Baby to Couplet care / Skin to Skin.  Cyril Mourning 05/02/2021, 1:16 PM

## 2020-12-29 ENCOUNTER — Other Ambulatory Visit: Payer: Self-pay

## 2020-12-29 ENCOUNTER — Emergency Department
Admission: RE | Admit: 2020-12-29 | Discharge: 2020-12-29 | Disposition: A | Discharge status: Home / Self Care | Payer: BC Managed Care – PPO | Source: Ambulatory Visit

## 2020-12-29 VITALS — BP 126/82 | HR 92 | Temp 98.0°F | Resp 18 | Ht 61.0 in | Wt 217.0 lb

## 2020-12-29 DIAGNOSIS — J029 Acute pharyngitis, unspecified: Secondary | ICD-10-CM | POA: Diagnosis not present

## 2020-12-29 DIAGNOSIS — Z3A21 21 weeks gestation of pregnancy: Secondary | ICD-10-CM

## 2020-12-29 LAB — POCT RAPID STREP A (OFFICE): Rapid Strep A Screen: NEGATIVE

## 2020-12-29 MED ORDER — AMOXICILLIN 500 MG PO CAPS: 500.0000 mg | ORAL_CAPSULE | Freq: Three times a day (TID) | ORAL | 0 refills | Status: AC

## 2020-12-29 NOTE — ED Triage Notes (Signed)
Pt presents to Urgent Care with c/o sore throat x 2 weeks. Pt tested positive for COVID 2 weeks ago and all symptoms have resolved except sore throat. She reports that her OB-GYN suggested that she have a strep test (pt is [redacted] weeks gestation).

## 2020-12-29 NOTE — Discharge Instructions (Addendum)
Both the lidocaine rinse and the nystatin gargle are both contraindicated in pregnancy therefore continue with the salt water gargles along with using throat lozenges. Start amoxicillin 500 mg take 3 times daily over the course the next 7 days.  Your throat culture is pending. You may also find some benefit from using some Chloraseptic spray from over-the-counter this may help with your throat pain.

## 2021-01-02 LAB — CULTURE, GROUP A STREP: Strep A Culture: NEGATIVE

## 2021-01-31 DIAGNOSIS — Z3480 Encounter for supervision of other normal pregnancy, unspecified trimester: Secondary | ICD-10-CM | POA: Diagnosis not present

## 2021-01-31 DIAGNOSIS — Z23 Encounter for immunization: Secondary | ICD-10-CM | POA: Diagnosis not present

## 2021-04-14 DIAGNOSIS — Z3685 Encounter for antenatal screening for Streptococcus B: Secondary | ICD-10-CM | POA: Diagnosis not present

## 2021-04-27 DIAGNOSIS — R609 Edema, unspecified: Secondary | ICD-10-CM | POA: Diagnosis not present

## 2021-04-28 ENCOUNTER — Encounter (HOSPITAL_COMMUNITY): Payer: Self-pay | Admitting: *Deleted

## 2021-04-28 ENCOUNTER — Telehealth (HOSPITAL_COMMUNITY): Payer: Self-pay | Admitting: *Deleted

## 2021-04-28 NOTE — Telephone Encounter (Signed)
Preadmission screen  

## 2021-05-02 ENCOUNTER — Other Ambulatory Visit: Payer: Self-pay

## 2021-05-02 ENCOUNTER — Inpatient Hospital Stay (HOSPITAL_COMMUNITY)
Admission: AD | Admit: 2021-05-02 | Discharge: 2021-05-04 | DRG: 807 | Disposition: A | Discharge status: Home / Self Care | Payer: BC Managed Care – PPO | Attending: Obstetrics and Gynecology | Admitting: Obstetrics and Gynecology

## 2021-05-02 ENCOUNTER — Encounter (HOSPITAL_COMMUNITY): Payer: Self-pay | Admitting: Obstetrics and Gynecology

## 2021-05-02 ENCOUNTER — Other Ambulatory Visit (HOSPITAL_COMMUNITY)
Admission: RE | Admit: 2021-05-02 | Discharge: 2021-05-02 | Disposition: A | Discharge status: Home / Self Care | Payer: BC Managed Care – PPO | Source: Ambulatory Visit | Attending: Obstetrics and Gynecology | Admitting: Obstetrics and Gynecology

## 2021-05-02 DIAGNOSIS — Z87891 Personal history of nicotine dependence: Secondary | ICD-10-CM | POA: Diagnosis not present

## 2021-05-02 DIAGNOSIS — Z20822 Contact with and (suspected) exposure to covid-19: Secondary | ICD-10-CM | POA: Diagnosis present

## 2021-05-02 DIAGNOSIS — Z01812 Encounter for preprocedural laboratory examination: Secondary | ICD-10-CM | POA: Insufficient documentation

## 2021-05-02 DIAGNOSIS — Z3A39 39 weeks gestation of pregnancy: Secondary | ICD-10-CM | POA: Diagnosis not present

## 2021-05-02 DIAGNOSIS — O26893 Other specified pregnancy related conditions, third trimester: Secondary | ICD-10-CM | POA: Diagnosis not present

## 2021-05-02 LAB — CBC
HCT: 37.7 % (ref 36.0–46.0)
Hemoglobin: 12.3 g/dL (ref 12.0–15.0)
MCH: 28.7 pg (ref 26.0–34.0)
MCHC: 32.6 g/dL (ref 30.0–36.0)
MCV: 87.9 fL (ref 80.0–100.0)
Platelets: 282 10*3/uL (ref 150–400)
RBC: 4.29 MIL/uL (ref 3.87–5.11)
RDW: 14 % (ref 11.5–15.5)
WBC: 9.9 10*3/uL (ref 4.0–10.5)
nRBC: 0 % (ref 0.0–0.2)

## 2021-05-02 LAB — TYPE AND SCREEN
ABO/RH(D): A POS
Antibody Screen: NEGATIVE

## 2021-05-02 LAB — RESP PANEL BY RT-PCR (FLU A&B, COVID) ARPGX2
Influenza A by PCR: NEGATIVE
Influenza B by PCR: NEGATIVE
SARS Coronavirus 2 by RT PCR: NEGATIVE

## 2021-05-02 LAB — SARS CORONAVIRUS 2 (TAT 6-24 HRS): SARS Coronavirus 2: NEGATIVE

## 2021-05-02 MED ORDER — ACETAMINOPHEN 325 MG PO TABS
650.0000 mg | ORAL_TABLET | ORAL | Status: DC | PRN
  Administered 2021-05-02 – 2021-05-03 (×2): 650 mg via ORAL
  Filled 2021-05-02 (×2): qty 2

## 2021-05-02 MED ORDER — FLEET ENEMA 7-19 GM/118ML RE ENEM: 1.0000 | ENEMA | RECTAL | Status: DC | PRN

## 2021-05-02 MED ORDER — OXYCODONE HCL 5 MG PO TABS
10.0000 mg | ORAL_TABLET | ORAL | Status: DC | PRN
Start: 2021-05-02 — End: 2021-05-04
  Administered 2021-05-02: 10 mg via ORAL
  Filled 2021-05-02: qty 2

## 2021-05-02 MED ORDER — DIBUCAINE (PERIANAL) 1 % EX OINT: 1.0000 "application " | TOPICAL_OINTMENT | CUTANEOUS | Status: DC | PRN

## 2021-05-02 MED ORDER — LIDOCAINE HCL (PF) 1 % IJ SOLN: 30.0000 mL | INTRAMUSCULAR | Status: DC | PRN

## 2021-05-02 MED ORDER — SENNOSIDES-DOCUSATE SODIUM 8.6-50 MG PO TABS
2.0000 | ORAL_TABLET | Freq: Every day | ORAL | Status: DC
  Administered 2021-05-03: 2 via ORAL
  Filled 2021-05-02: qty 2

## 2021-05-02 MED ORDER — OXYCODONE-ACETAMINOPHEN 5-325 MG PO TABS: 1.0000 | ORAL_TABLET | ORAL | Status: DC | PRN

## 2021-05-02 MED ORDER — MEDROXYPROGESTERONE ACETATE 150 MG/ML IM SUSP: 150.0000 mg | INTRAMUSCULAR | Status: DC | PRN

## 2021-05-02 MED ORDER — OXYTOCIN BOLUS FROM INFUSION: 333.0000 mL | Freq: Once | INTRAVENOUS | Status: DC

## 2021-05-02 MED ORDER — FLEET ENEMA 7-19 GM/118ML RE ENEM: 1.0000 | ENEMA | Freq: Every day | RECTAL | Status: DC | PRN

## 2021-05-02 MED ORDER — PRENATAL MULTIVITAMIN CH
1.0000 | ORAL_TABLET | Freq: Every day | ORAL | Status: DC
  Administered 2021-05-03: 1 via ORAL
  Filled 2021-05-02: qty 1

## 2021-05-02 MED ORDER — ZOLPIDEM TARTRATE 5 MG PO TABS: 5.0000 mg | ORAL_TABLET | Freq: Every evening | ORAL | Status: DC | PRN

## 2021-05-02 MED ORDER — MEASLES, MUMPS & RUBELLA VAC IJ SOLR: 0.5000 mL | Freq: Once | INTRAMUSCULAR | Status: DC

## 2021-05-02 MED ORDER — SOD CITRATE-CITRIC ACID 500-334 MG/5ML PO SOLN: 30.0000 mL | ORAL | Status: DC | PRN

## 2021-05-02 MED ORDER — COCONUT OIL OIL: 1.0000 "application " | TOPICAL_OIL | Status: DC | PRN

## 2021-05-02 MED ORDER — ONDANSETRON HCL 4 MG/2ML IJ SOLN: 4.0000 mg | INTRAMUSCULAR | Status: DC | PRN

## 2021-05-02 MED ORDER — ONDANSETRON HCL 4 MG/2ML IJ SOLN: 4.0000 mg | Freq: Four times a day (QID) | INTRAMUSCULAR | Status: DC | PRN

## 2021-05-02 MED ORDER — LACTATED RINGERS IV SOLN: INTRAVENOUS | Status: DC

## 2021-05-02 MED ORDER — IBUPROFEN 600 MG PO TABS
600.0000 mg | ORAL_TABLET | Freq: Four times a day (QID) | ORAL | Status: DC
  Administered 2021-05-02 – 2021-05-04 (×7): 600 mg via ORAL
  Filled 2021-05-02 (×7): qty 1

## 2021-05-02 MED ORDER — BENZOCAINE-MENTHOL 20-0.5 % EX AERO: 1.0000 "application " | INHALATION_SPRAY | CUTANEOUS | Status: DC | PRN

## 2021-05-02 MED ORDER — DIPHENHYDRAMINE HCL 25 MG PO CAPS: 25.0000 mg | ORAL_CAPSULE | Freq: Four times a day (QID) | ORAL | Status: DC | PRN

## 2021-05-02 MED ORDER — LACTATED RINGERS IV SOLN: 500.0000 mL | INTRAVENOUS | Status: DC | PRN

## 2021-05-02 MED ORDER — ACETAMINOPHEN 325 MG PO TABS: 650.0000 mg | ORAL_TABLET | ORAL | Status: DC | PRN

## 2021-05-02 MED ORDER — BISACODYL 10 MG RE SUPP: 10.0000 mg | Freq: Every day | RECTAL | Status: DC | PRN

## 2021-05-02 MED ORDER — WITCH HAZEL-GLYCERIN EX PADS: 1.0000 "application " | MEDICATED_PAD | CUTANEOUS | Status: DC | PRN

## 2021-05-02 MED ORDER — OXYCODONE HCL 5 MG PO TABS: 5.0000 mg | ORAL_TABLET | ORAL | Status: DC | PRN

## 2021-05-02 MED ORDER — SIMETHICONE 80 MG PO CHEW: 80.0000 mg | CHEWABLE_TABLET | ORAL | Status: DC | PRN

## 2021-05-02 MED ORDER — ONDANSETRON HCL 4 MG PO TABS: 4.0000 mg | ORAL_TABLET | ORAL | Status: DC | PRN

## 2021-05-02 MED ORDER — OXYTOCIN-SODIUM CHLORIDE 30-0.9 UT/500ML-% IV SOLN: 2.5000 [IU]/h | INTRAVENOUS | Status: DC

## 2021-05-02 MED ORDER — TETANUS-DIPHTH-ACELL PERTUSSIS 5-2.5-18.5 LF-MCG/0.5 IM SUSY: 0.5000 mL | PREFILLED_SYRINGE | Freq: Once | INTRAMUSCULAR | Status: DC

## 2021-05-02 MED ORDER — OXYCODONE-ACETAMINOPHEN 5-325 MG PO TABS: 2.0000 | ORAL_TABLET | ORAL | Status: DC | PRN

## 2021-05-02 MED ORDER — OXYTOCIN-SODIUM CHLORIDE 30-0.9 UT/500ML-% IV SOLN
INTRAVENOUS | Status: AC
  Filled 2021-05-02: qty 500

## 2021-05-02 NOTE — H&P (Signed)
Tonya Chan is a 35 y.o. G 5 P 3013 at 92 w 3 days presents in active labor. 9 cm in triage  OB History    Gravida  5   Para  3   Term  3   Preterm  0   AB  1   Living  3     SAB  1   IAB  0   Ectopic  0   Multiple  0   Live Births  3          Past Medical History:  Diagnosis Date  . History of postpartum hemorrhage, currently pregnant   . No pertinent past medical history    Past Surgical History:  Procedure Laterality Date  . TYMPANOSTOMY TUBE PLACEMENT Bilateral    Family History: family history includes Bladder Cancer in her maternal grandfather; Breast cancer in her maternal grandmother; Colon cancer in her maternal grandmother; Diabetes in her maternal grandfather and maternal uncle; Heart disease in her maternal grandfather; Hypertension in her father; Lupus in her mother; Pulmonary Hypertension in her mother; Stroke in her paternal grandfather; Thyroid disease in her maternal grandmother. Social History:  reports that she quit smoking about 7 years ago. She quit smokeless tobacco use about 9 years ago. She reports that she does not drink alcohol and does not use drugs.     Maternal Diabetes: No Genetic Screening: Normal Maternal Ultrasounds/Referrals: Normal Fetal Ultrasounds or other Referrals:  None Maternal Substance Abuse:  No Significant Maternal Medications:  None Significant Maternal Lab Results:  None Other Comments:  None  Review of Systems  All other systems reviewed and are negative.  Maternal Medical History:  Reason for admission: Contractions.       unknown if currently breastfeeding. Maternal Exam:  Abdomen: Fetal presentation: vertex     Fetal Exam Fetal State Assessment: Category I - tracings are normal.     Physical Exam Vitals and nursing note reviewed. Exam conducted with a chaperone present.  Constitutional:      Appearance: Normal appearance.  Eyes:     Pupils: Pupils are equal, round, and reactive to  light.  Cardiovascular:     Rate and Rhythm: Normal rate and regular rhythm.  Musculoskeletal:     Cervical back: Normal range of motion.  Neurological:     Mental Status: She is alert.     Prenatal labs: ABO, Rh: A/Positive/-- (10/12 0000) Antibody: Negative (10/12 0000) Rubella: Immune (10/12 0000) RPR: Nonreactive (10/12 0000)  HBsAg: Negative (10/12 0000)  HIV: Non-reactive (10/12 0000)  GBS:     Assessment/Plan: IUP at 39 w 2 days LABOR Anticipate NSVD soon   Cyril Mourning 05/02/2021, 12:18 PM

## 2021-05-02 NOTE — Lactation Note (Signed)
This note was copied from a baby's chart. Lactation Consultation Note LC to L&D for bf assistance. Observed independent latch with rhythmic suckling. Baby has bruised face from delivery. Briefly reviewed expectations in 1st 24 hours and answered mother's questions. Will plan f/u on MBU.  Patient Name: Tonya Chan Date: 05/02/2021 Reason for consult: L&D Initial assessment Age:36 hours  Maternal Data Does the patient have breastfeeding experience prior to this delivery?: Yes How long did the patient breastfeed?: 22 months with last baby No hx breast surgery/trauma Last baby weaned 12-21  Feeding Mother's Current Feeding Choice: Breast Milk  LATCH Score Latch: Grasps breast easily, tongue down, lips flanged, rhythmical sucking.  Audible Swallowing: Spontaneous and intermittent  Type of Nipple: Everted at rest and after stimulation  Comfort (Breast/Nipple): Soft / non-tender  Hold (Positioning): No assistance needed to correctly position infant at breast.  LATCH Score: 10   Interventions Interventions: Breast feeding basics reviewed;Support pillows  Consult Status Consult Status: Follow-up Follow-up type: [place], [place] 05/02/2021, 2:25 PM

## 2021-05-02 NOTE — MAU Note (Signed)
Pt arrived at MAU with contractions all morning. Is intact and has VB. +FM. SVE by CNM was 9/90/+1.  Was taken upstairs with CNM. Hand off to Reeves Forth.

## 2021-05-02 NOTE — Lactation Note (Signed)
This note was copied from a baby's chart. Lactation Consultation Note Baby 82 hrs old. Mom stated baby is BF well. Having no issues. Reminded mom of the difference of newborn feeding verses toddler BF. Mom experienced BF. Mom has no questions or concerns at this time. Encouraged mom if she has anything to come up to call for LC. Lactation brochure given.  Patient Name: Tonya Chan [id number] Date: 05/02/2021 Reason for consult: Initial assessment;Term Age:47 hours  Maternal Data Has patient been taught Hand Expression?: Yes Does the patient have breastfeeding experience prior to this delivery?: Yes How long did the patient breastfeed?: 1st child-8 months, 2nd child-14 months, 3rd child-2 yrs.  Feeding    LATCH Score                    Lactation Tools Discussed/Used    Interventions Interventions: Breast feeding basics reviewed  Discharge    Consult Status Consult Status: Complete Date: 05/02/21    Theodoro Kalata 05/02/2021, 10:27 PM

## 2021-05-02 NOTE — Progress Notes (Signed)
None    S: Ms. ALAYSIAH BROWDER is a 35 y.o. [phone] at [redacted]w[redacted]d who presents to MAU today complaining contractions q 2-5 minutes since early this morning, with significant increase in intensity in the past hour. She denies vaginal bleeding. She denies LOF. She reports normal fetal movement.    Patient receives care with [facility] and is scheduled for IOL tomorrow  O: There were no vitals taken for this visit. GENERAL: Well-developed, well-nourished female in no acute distress.  HEAD: Normocephalic, atraumatic.  CHEST: Normal effort of breathing, regular heart rate ABDOMEN: Soft, nontender, gravid  Fetal Monitoring: Baseline: 140 Variability: Mod Accelerations: 15 x 15 Decelerations: none Contractions: q 2-3 per patient Cephalic by suture  A: SIUP at [id number] met patient on arrival due to report from registration that patient felt birth was imminent 9/90/0 to +1 per my exam, no urge to push, membranes intact, patient safe for transfer Report called to Dr. GrHelane Rimawho is on site on L&D  P: Admit to L&D Patient escorted to L&D by Traci RN and this CNM Dr. GrHelane Rimat room door on arrival to L&D  WeDarlina RumpfCNM 05/02/2021 12:37 PM

## 2021-05-03 ENCOUNTER — Inpatient Hospital Stay (HOSPITAL_COMMUNITY): Payer: BC Managed Care – PPO

## 2021-05-03 ENCOUNTER — Inpatient Hospital Stay (HOSPITAL_COMMUNITY)
Admission: AD | Admit: 2021-05-03 | Payer: BC Managed Care – PPO | Source: Home / Self Care | Admitting: Obstetrics and Gynecology

## 2021-05-03 LAB — CBC
HCT: 30 % — ABNORMAL LOW (ref 36.0–46.0)
Hemoglobin: 9.8 g/dL — ABNORMAL LOW (ref 12.0–15.0)
MCH: 29.3 pg (ref 26.0–34.0)
MCHC: 32.7 g/dL (ref 30.0–36.0)
MCV: 89.6 fL (ref 80.0–100.0)
Platelets: 276 10*3/uL (ref 150–400)
RBC: 3.35 MIL/uL — ABNORMAL LOW (ref 3.87–5.11)
RDW: 14.1 % (ref 11.5–15.5)
WBC: 13.1 10*3/uL — ABNORMAL HIGH (ref 4.0–10.5)
nRBC: 0 % (ref 0.0–0.2)

## 2021-05-03 LAB — RPR: RPR Ser Ql: NONREACTIVE

## 2021-05-03 NOTE — Plan of Care (Signed)
  Problem: Education: Goal: Knowledge of condition will improve Outcome: Completed/Met   Problem: Activity: Goal: Will verbalize the importance of balancing activity with adequate rest periods Outcome: Completed/Met Goal: Ability to tolerate increased activity will improve Outcome: Completed/Met   Problem: Life Cycle: Goal: Chance of risk for complications during the postpartum period will decrease Outcome: Completed/Met   Problem: Role Relationship: Goal: Ability to demonstrate positive interaction with newborn will improve Outcome: Completed/Met   Problem: Skin Integrity: Goal: Demonstration of wound healing without infection will improve Outcome: Completed/Met   Problem: Clinical Measurements: Goal: Ability to maintain clinical measurements within normal limits will improve Outcome: Completed/Met

## 2021-05-03 NOTE — Progress Notes (Signed)
Post Partum Day 1 Subjective: no complaints, up ad lib, voiding and tolerating PO  Objective: Blood pressure 130/84, pulse (!) 105, temperature 98.2 F (36.8 C), temperature source Oral, resp. rate 18, SpO2 99 %, unknown if currently breastfeeding.  Physical Exam:  General: alert, cooperative, appears stated age and no distress Lochia: appropriate Uterine Fundus: firm Incision: healing well DVT Evaluation: No evidence of DVT seen on physical exam.  Recent Labs    05/02/21 1238 05/03/21 0536  HGB 12.3 9.8*  HCT 37.7 30.0*    Assessment/Plan: Plan for discharge tomorrow, Breastfeeding and Circumcision prior to discharge   LOS: 1 day   Luz Lex 05/03/2021, 9:19 AM

## 2021-05-04 NOTE — Discharge Summary (Signed)
Postpartum Discharge Summary    Patient Name: Tonya Chan DOB: Jan 07, 1986 MRN: [id number]  Date of admission: 05/02/2021 Delivery date:05/02/2021  Delivering provider: Dian Queen  Date of discharge: 05/04/2021  Admitting diagnosis: Normal labor and delivery [O80] NSVD (normal spontaneous vaginal delivery) [O80] Intrauterine pregnancy: [redacted]w[redacted]d    Secondary diagnosis:  Active Problems:   Normal labor and delivery  Additional problems: none    Discharge diagnosis: Term Pregnancy Delivered                                              Post partum procedures:none Augmentation: N/A Complications: None  Hospital course: Onset of Labor With Vaginal Delivery      35y.o. yo [id number] admitted in Active Labor on 05/02/2021. Patient had an uncomplicated labor course as follows:  Membrane Rupture Time/Date: 12:29 PM ,05/02/2021   Delivery Method:Vaginal, Spontaneous  Episiotomy: None  Lacerations:  None  Patient had an uncomplicated postpartum course.  She is ambulating, tolerating a regular diet, passing flatus, and urinating well. Patient is discharged home in stable condition on 05/04/21.  Newborn Data: Birth date:05/02/2021  Birth time:1:02 PM  Gender:Female  Living status:Living  Apgars:8 ,9  Weight:3997 g   Magnesium Sulfate received: No BMZ received: No Rhophylac:N/A MMR:N/A T-DaP:Given prenatally Flu: N/A Transfusion:No  Physical exam  Vitals:   05/03/21 0614 05/03/21 1354 05/03/21 2124 05/04/21 0556  BP: 130/84 119/81 131/80 123/83  Pulse: (!) 105 (!) 105 (!) 101 91  Resp: '18 19 19 18  ' Temp: 98.2 F (36.8 C) 98 F (36.7 C) 98.3 F (36.8 C) 98.1 F (36.7 C)  TempSrc: Oral Oral Axillary Oral  SpO2:  98% 99% 98%   General: alert Lochia: appropriate Uterine Fundus: firm Incision: N/A DVT Evaluation: No evidence of DVT seen on physical exam. Labs: Lab Results  Component Value Date   WBC 13.1 (H) 05/03/2021   HGB 9.8 (L) 05/03/2021   HCT  30.0 (L) 05/03/2021   MCV 89.6 05/03/2021   PLT 276 05/03/2021   CMP Latest Ref Rng & Units 05/17/2015  Glucose 65 - 99 mg/dL 80  BUN 6 - 20 mg/dL 8  Creatinine 0.44 - 1.00 mg/dL 0.48  Sodium 135 - 145 mmol/L 132(L)  Potassium 3.5 - 5.1 mmol/L 3.6  Chloride 101 - 111 mmol/L 107  CO2 22 - 32 mmol/L 20(L)  Calcium 8.9 - 10.3 mg/dL 8.6(L)  Total Protein 6.5 - 8.1 g/dL 6.3(L)  Total Bilirubin 0.3 - 1.2 mg/dL 0.6  Alkaline Phos 38 - 126 U/L 98  AST 15 - 41 U/L 20  ALT 14 - 54 U/L 15   Edinburgh Score: Edinburgh Postnatal Depression Scale Screening Tool 05/03/2021  I have been able to laugh and see the funny side of things. 0  I have looked forward with enjoyment to things. 0  I have blamed myself unnecessarily when things went wrong. 0  I have been anxious or worried for no good reason. 0  I have felt scared or panicky for no good reason. 0  Things have been getting on top of me. 0  I have been so unhappy that I have had difficulty sleeping. 0  I have felt sad or miserable. 0  I have been so unhappy that I have been crying. 0  The thought of harming myself has occurred to me.  0  Edinburgh Postnatal Depression Scale Total 0      After visit meds:  Allergies as of 05/04/2021   No Known Allergies     Medication List    TAKE these medications   prenatal multivitamin Tabs tablet Take 1 tablet by mouth at bedtime.        Discharge home in stable condition Infant Feeding: Breast Infant Disposition:home with mother Discharge instruction: per After Visit Summary and Postpartum booklet. Activity: Advance as tolerated. Pelvic rest for 6 weeks.  Diet: routine diet Anticipated Birth Control: Unsure Postpartum Appointment:6 weeks Additional Postpartum F/U: none Future Appointments:No future appointments. Follow up Visit:      05/04/2021 Tyson Dense, MD

## 2021-06-07 DIAGNOSIS — Z1389 Encounter for screening for other disorder: Secondary | ICD-10-CM | POA: Diagnosis not present

## 2021-06-07 DIAGNOSIS — Z30430 Encounter for insertion of intrauterine contraceptive device: Secondary | ICD-10-CM | POA: Diagnosis not present

## 2021-07-19 DIAGNOSIS — N939 Abnormal uterine and vaginal bleeding, unspecified: Secondary | ICD-10-CM | POA: Diagnosis not present

## 2021-07-19 DIAGNOSIS — Z30431 Encounter for routine checking of intrauterine contraceptive device: Secondary | ICD-10-CM | POA: Diagnosis not present

## 2021-08-10 ENCOUNTER — Encounter: Payer: Self-pay | Admitting: Nurse Practitioner

## 2021-08-10 ENCOUNTER — Ambulatory Visit: Payer: BC Managed Care – PPO | Admitting: Nurse Practitioner

## 2021-08-10 ENCOUNTER — Other Ambulatory Visit: Payer: Self-pay

## 2021-08-10 ENCOUNTER — Ambulatory Visit (INDEPENDENT_AMBULATORY_CARE_PROVIDER_SITE_OTHER): Payer: BC Managed Care – PPO | Admitting: Nurse Practitioner

## 2021-08-10 VITALS — BP 115/76 | HR 71 | Temp 97.8°F | Ht 61.0 in | Wt 223.3 lb

## 2021-08-10 DIAGNOSIS — B09 Unspecified viral infection characterized by skin and mucous membrane lesions: Secondary | ICD-10-CM

## 2021-08-10 DIAGNOSIS — R21 Rash and other nonspecific skin eruption: Secondary | ICD-10-CM | POA: Diagnosis not present

## 2021-08-10 DIAGNOSIS — Z7689 Persons encountering health services in other specified circumstances: Secondary | ICD-10-CM | POA: Diagnosis not present

## 2021-08-11 ENCOUNTER — Telehealth: Payer: Self-pay | Admitting: Nurse Practitioner

## 2021-08-11 MED ORDER — VALACYCLOVIR HCL 1 G PO TABS: 1000.0000 mg | ORAL_TABLET | Freq: Two times a day (BID) | ORAL | 0 refills | Status: DC

## 2021-08-11 NOTE — Telephone Encounter (Signed)
Left msg for patient to call back. AS, CMA 

## 2021-08-11 NOTE — Progress Notes (Signed)
New Patient Office Visit  Subjective:  Patient ID: Tonya Chan, female    DOB: August 26, 1986  Age: 35 y.o. MRN: [id number]  CC:  Chief Complaint  Patient presents with   Rash    HPI Tonya Chan presents to establish new primary care provider.  She states that she currently has a rash that developed approximately 5 days ago.  Started underneath her right arm.  States it was itching and burning on Saturday.  When she woke up on Sunday morning, rash had turned into multiple pustular lesions that were extremely itchy and painful.  Rash then spread to the right chest area.  Rash exhibiting same pressure form since rash and underarm region.  Last day, she has noticed some rash in the middle of her back.,  Just right at this time.  She states she is having some achiness.  She is also very fatigued.  She is currently breast-feeding mom.  She denies fever.  States no other family members are experiencing similar symptoms.  She denies fever or chills.  She denies headache.  She denies nausea, vomiting, or diarrhea.  Past Medical History:  Diagnosis Date   History of postpartum hemorrhage, currently pregnant    No pertinent past medical history     Past Surgical History:  Procedure Laterality Date   TYMPANOSTOMY TUBE PLACEMENT Bilateral     Family History  Problem Relation Age of Onset   Hypertension Father    Diabetes Maternal Uncle    Breast cancer Maternal Grandmother    Colon cancer Maternal Grandmother    Thyroid disease Maternal Grandmother    Heart disease Maternal Grandfather    Diabetes Maternal Grandfather    Bladder Cancer Maternal Grandfather    Stroke Paternal Grandfather    Lupus Mother    Pulmonary Hypertension Mother     Social History   Socioeconomic History   Marital status: Married    Spouse name: Not on file   Number of children: Not on file   Years of education: Not on file   Highest education level: Not on file  Occupational History   Not on file   Tobacco Use   Smoking status: Former    Types: Cigarettes    Quit date: 03/18/2014    Years since quitting: 7.4   Smokeless tobacco: Former    Quit date: 01/19/2012  Vaping Use   Vaping Use: Never used  Substance and Sexual Activity   Alcohol use: No   Drug use: No   Sexual activity: Yes  Other Topics Concern   Not on file  Social History Narrative   Not on file   Social Determinants of Health   Financial Resource Strain: Not on file  Food Insecurity: Not on file  Transportation Needs: Not on file  Physical Activity: Not on file  Stress: Not on file  Social Connections: Not on file  Intimate Partner Violence: Not on file    ROS Review of Systems  Constitutional:  Positive for fatigue. Negative for activity change, appetite change, chills and fever.  HENT:  Negative for congestion, postnasal drip, rhinorrhea, sinus pressure, sinus pain, sneezing and sore throat.   Eyes: Negative.   Respiratory:  Negative for cough, chest tightness, shortness of breath and wheezing.   Cardiovascular:  Negative for chest pain and palpitations.  Gastrointestinal:  Negative for abdominal pain, constipation, diarrhea, nausea and vomiting.  Endocrine: Negative for cold intolerance, heat intolerance, polydipsia and polyuria.  Genitourinary:  Negative for dyspareunia, dysuria,  flank pain, frequency and urgency.  Musculoskeletal:  Negative for arthralgias, back pain and myalgias.  Skin:  Positive for rash.       The patient has a very tender and very itchy rash under the right arm, on the right side of the, in the middle of her back of the spine.  Allergic/Immunologic: Negative for environmental allergies.  Neurological:  Negative for dizziness, weakness and headaches.  Hematological:  Negative for adenopathy.  Psychiatric/Behavioral:  The patient is not nervous/anxious.    Objective:   Today's Vitals   08/10/21 1604  BP: 115/76  Pulse: 71  Temp: 97.8 F (36.6 C)  SpO2: 99%  Weight: 223 lb  4.8 oz (101.3 kg)  Height: '5\' 1"'$  (1.549 m)   Body mass index is 42.19 kg/m.   Physical Exam Vitals and nursing note reviewed.  Constitutional:      Appearance: Normal appearance. She is well-developed. She is obese.  HENT:     Head: Normocephalic and atraumatic.     Nose: Nose normal.     Mouth/Throat:     Mouth: Mucous membranes are dry.     Pharynx: Oropharynx is clear.  Eyes:     Extraocular Movements: Extraocular movements intact.     Conjunctiva/sclera: Conjunctivae normal.     Pupils: Pupils are equal, round, and reactive to light.  Cardiovascular:     Rate and Rhythm: Normal rate and regular rhythm.     Pulses: Normal pulses.     Heart sounds: Normal heart sounds.  Pulmonary:     Effort: Pulmonary effort is normal.     Breath sounds: Normal breath sounds.  Abdominal:     Palpations: Abdomen is soft.  Musculoskeletal:        General: Normal range of motion.     Cervical back: Normal range of motion and neck supple.  Lymphadenopathy:     Cervical: No cervical adenopathy.  Skin:    General: Skin is warm and dry.     Capillary Refill: Capillary refill takes less than 2 seconds.     Findings: Erythema and rash present.     Comments: There is partial rash in right axillary region.  Rash comprised tightly spreading pustular lesions in the right axilla.  There is localized erythema.  Lesions are tender and very itchy.  There is a second, small area on the right chest.  There is a third very small region in the upper back just right of the spine.  Neurological:     General: No focal deficit present.     Mental Status: She is alert and oriented to person, place, and time.  Psychiatric:        Mood and Affect: Mood normal.        Behavior: Behavior normal.        Thought Content: Thought content normal.        Judgment: Judgment normal.    Assessment & Plan:  1. Encounter to establish care Appointment today to establish new primary care provider.  2. Rash in  adult This is likely to be viral rash.  There is suspicion for monkey pox versus shingles rash (atypical presentation for either). Testing done for monkey pox today.  We will contact patient with results when they are available as well as recommendations for treatment.  In the meantime, will treat with valacyclovir 1000 mg twice daily for next 5 days.  Recommend she contact her infants pediatrician to inquire about any risks this viral rash may cause  her infant.  - Monkeypox Virus DNA, Qualitative Real-Time PCR - valACYclovir (VALTREX) 1000 MG tablet; Take 1 tablet (1,000 mg total) by mouth 2 (two) times daily.  Dispense: 10 tablet; Refill: 0   Problem List Items Addressed This Visit       Musculoskeletal and Integument   Rash in adult   Relevant Medications   valACYclovir (VALTREX) 1000 MG tablet   Other Relevant Orders   Monkeypox Virus DNA, Qualitative Real-Time PCR (Completed)     Other   Encounter to establish care - Primary    Outpatient Encounter Medications as of 08/10/2021  Medication Sig   valACYclovir (VALTREX) 1000 MG tablet Take 1 tablet (1,000 mg total) by mouth 2 (two) times daily.   Prenatal Vit-Fe Fumarate-FA (PRENATAL MULTIVITAMIN) TABS Take 1 tablet by mouth at bedtime.   No facility-administered encounter medications on file as of 08/10/2021.   This note was dictated using Systems analyst. Rapid proofreading was performed to expedite the delivery of the information. Despite proofreading, phonetic errors will occur which are common with this voice recognition software. Please take this into consideration. If there are any concerns, please contact our office.    Follow-up: No follow-ups on file.   Ronnell Freshwater, NP

## 2021-08-11 NOTE — Telephone Encounter (Signed)
Patient is aware of the below and verbalized understanding. AS, CMA 

## 2021-08-12 LAB — MONKEYPOX VIRUS DNA, QUALITATIVE REAL-TIME PCR: Orthopoxvirus DNA, QL PCR: NOT DETECTED

## 2021-08-12 LAB — SPECIMEN STATUS REPORT

## 2021-08-14 NOTE — Progress Notes (Signed)
Hey. Please let the patient know that screening for monkey pox is negative. I did add valacyclovir for her and I hope she is doing well. We can go ahead and schedule her for new patient/follow up appointment. Thanks

## 2021-08-15 ENCOUNTER — Telehealth: Payer: Self-pay | Admitting: Nurse Practitioner

## 2021-08-15 NOTE — Telephone Encounter (Signed)
Patient is returning your call.    Thanks

## 2021-08-21 DIAGNOSIS — Z7689 Persons encountering health services in other specified circumstances: Secondary | ICD-10-CM | POA: Insufficient documentation

## 2021-08-21 DIAGNOSIS — B09 Unspecified viral infection characterized by skin and mucous membrane lesions: Secondary | ICD-10-CM | POA: Insufficient documentation

## 2021-08-21 DIAGNOSIS — R21 Rash and other nonspecific skin eruption: Secondary | ICD-10-CM | POA: Insufficient documentation

## 2021-08-23 ENCOUNTER — Ambulatory Visit (INDEPENDENT_AMBULATORY_CARE_PROVIDER_SITE_OTHER): Payer: BC Managed Care – PPO | Admitting: Nurse Practitioner

## 2021-08-23 ENCOUNTER — Encounter: Payer: Self-pay | Admitting: Nurse Practitioner

## 2021-08-23 ENCOUNTER — Other Ambulatory Visit: Payer: Self-pay

## 2021-08-23 VITALS — BP 113/79 | HR 78 | Temp 97.8°F | Ht 61.0 in | Wt 220.1 lb

## 2021-08-23 DIAGNOSIS — Z0001 Encounter for general adult medical examination with abnormal findings: Secondary | ICD-10-CM | POA: Diagnosis not present

## 2021-08-23 DIAGNOSIS — D485 Neoplasm of uncertain behavior of skin: Secondary | ICD-10-CM

## 2021-08-23 DIAGNOSIS — Z0000 Encounter for general adult medical examination without abnormal findings: Secondary | ICD-10-CM

## 2021-08-23 DIAGNOSIS — Z6841 Body Mass Index (BMI) 40.0 and over, adult: Secondary | ICD-10-CM | POA: Diagnosis not present

## 2021-08-23 DIAGNOSIS — B09 Unspecified viral infection characterized by skin and mucous membrane lesions: Secondary | ICD-10-CM

## 2021-08-23 NOTE — Progress Notes (Signed)
Established Patient Office Visit  Subjective:  Patient ID: Tonya Chan, female    DOB: [birth year]  Age: 35 y.o. MRN: [id number]  CC:  Chief Complaint  Patient presents with   Annual Exam    HPI Tonya Chan presents for annual wellness visit.  She was treated for viral type rash in right axillary region, right chest and right upper back at her initial visit.  Screening was negative for monkey pox.  Suspect she had an unusual presentation of shingles.  Was treated with 5 days valacyclovir.  Rash is healing.  Few scabbed lesions remain.  Much less itching.  Children all doing fine, including infant son who continues to be nursed. Patient states she has a mole in the center of her throat which has been present for as long as she can remember.  Recent months that she symptoms changed.  Is getting darker in color and got a hair out of the center.  She states it has periodic drainage.  Would like to have it looked at and removed per dermatology.  She states she has other smaller moles that she would also like to have looked at. She has no new concerns or complaints today.  She does see GYN for well woman care.  She is due for routine, fasting labs.  She denies chest pain, chest pressure, or shortness of breath. He denies headaches or visual disturbances. He denies abdominal pain, nausea, vomiting, or changes in bowel or bladder habits.    Past Medical History:  Diagnosis Date   History of postpartum hemorrhage, currently pregnant    No pertinent past medical history     Past Surgical History:  Procedure Laterality Date   TYMPANOSTOMY TUBE PLACEMENT Bilateral     Family History  Problem Relation Age of Onset   Hypertension Father    Diabetes Maternal Uncle    Breast cancer Maternal Grandmother    Colon cancer Maternal Grandmother    Thyroid disease Maternal Grandmother    Heart disease Maternal Grandfather    Diabetes Maternal Grandfather    Bladder Cancer Maternal Grandfather     Stroke Paternal Grandfather    Lupus Mother    Pulmonary Hypertension Mother     Social History   Socioeconomic History   Marital status: Married    Spouse name: Not on file   Number of children: Not on file   Years of education: Not on file   Highest education level: Not on file  Occupational History   Not on file  Tobacco Use   Smoking status: Former    Types: Cigarettes    Quit date: 03/18/2014    Years since quitting: 7.4   Smokeless tobacco: Former    Quit date: 01/19/2012  Vaping Use   Vaping Use: Never used  Substance and Sexual Activity   Alcohol use: No   Drug use: No   Sexual activity: Yes  Other Topics Concern   Not on file  Social History Narrative   Not on file   Social Determinants of Health   Financial Resource Strain: Not on file  Food Insecurity: Not on file  Transportation Needs: Not on file  Physical Activity: Not on file  Stress: Not on file  Social Connections: Not on file  Intimate Partner Violence: Not on file    Outpatient Medications Prior to Visit  Medication Sig Dispense Refill   Prenatal Vit-Fe Fumarate-FA (PRENATAL MULTIVITAMIN) TABS Take 1 tablet by mouth at bedtime.  valACYclovir (VALTREX) 1000 MG tablet Take 1 tablet (1,000 mg total) by mouth 2 (two) times daily. 10 tablet 0   No facility-administered medications prior to visit.    No Known Allergies  ROS Review of Systems  Constitutional:  Negative for activity change, appetite change, chills, fatigue and fever.  HENT:  Negative for congestion, postnasal drip, rhinorrhea, sinus pressure, sinus pain, sneezing and sore throat.   Eyes: Negative.   Respiratory:  Negative for cough, chest tightness, shortness of breath and wheezing.   Cardiovascular:  Negative for chest pain and palpitations.  Gastrointestinal:  Negative for abdominal pain, constipation, diarrhea, nausea and vomiting.  Endocrine: Negative for cold intolerance, heat intolerance, polydipsia and polyuria.   Genitourinary:  Negative for dyspareunia, dysuria, flank pain, frequency and urgency.  Musculoskeletal:  Negative for arthralgias, back pain and myalgias.  Skin:  Negative for rash.       Patient has single mole in the center of her throat which is starting to get better and darker.  She has noticed a single hair growing out normal.  Having some periodic drainage.  Would like to have looked at for dermatology.  Allergic/Immunologic: Negative for environmental allergies.  Neurological:  Negative for dizziness, weakness and headaches.  Hematological:  Negative for adenopathy.  Psychiatric/Behavioral:  The patient is not nervous/anxious.      Objective:    Physical Exam Vitals and nursing note reviewed.  Constitutional:      Appearance: Normal appearance. She is well-developed. She is obese.  HENT:     Head: Normocephalic and atraumatic.     Right Ear: Tympanic membrane, ear canal and external ear normal.     Left Ear: Tympanic membrane, ear canal and external ear normal.     Nose: Nose normal.     Mouth/Throat:     Mouth: Mucous membranes are moist.     Pharynx: Oropharynx is clear.  Eyes:     Extraocular Movements: Extraocular movements intact.     Conjunctiva/sclera: Conjunctivae normal.     Pupils: Pupils are equal, round, and reactive to light.  Cardiovascular:     Rate and Rhythm: Normal rate and regular rhythm.     Pulses: Normal pulses.     Heart sounds: Normal heart sounds.  Pulmonary:     Effort: Pulmonary effort is normal.     Breath sounds: Normal breath sounds.  Abdominal:     General: Bowel sounds are normal. There is no distension.     Palpations: Abdomen is soft. There is no mass.     Tenderness: There is no abdominal tenderness. There is no guarding or rebound.     Hernia: No hernia is present.  Musculoskeletal:        General: Normal range of motion.     Cervical back: Normal range of motion and neck supple.  Lymphadenopathy:     Cervical: No cervical  adenopathy.  Skin:    General: Skin is warm and dry.     Capillary Refill: Capillary refill takes less than 2 seconds.       Neurological:     General: No focal deficit present.     Mental Status: She is alert and oriented to person, place, and time.  Psychiatric:        Mood and Affect: Mood normal.        Behavior: Behavior normal.        Thought Content: Thought content normal.        Judgment: Judgment normal.  Today's Vitals   08/23/21 1645  BP: 113/79  Pulse: 78  Temp: 97.8 F (36.6 C)  SpO2: 97%  Weight: 220 lb 1.6 oz (99.8 kg)  Height: '5\' 1"'  (1.549 m)   Body mass index is 41.59 kg/m.   Wt Readings from Last 3 Encounters:  08/23/21 220 lb 1.6 oz (99.8 kg)  08/10/21 223 lb 4.8 oz (101.3 kg)  12/29/20 217 lb (98.4 kg)     Health Maintenance Due  Topic Date Due   Pneumococcal Vaccine 63-53 Years old (1 - PCV) Never done   Hepatitis C Screening  Never done   PAP SMEAR-Modifier  Never done   INFLUENZA VACCINE  07/18/2021    There are no preventive care reminders to display for this patient.  Lab Results  Component Value Date   TSH 5.440 (H) 08/23/2021   Lab Results  Component Value Date   WBC 6.3 08/23/2021   HGB 13.3 08/23/2021   HCT 40.8 08/23/2021   MCV 83 08/23/2021   PLT 381 08/23/2021   Lab Results  Component Value Date   NA 138 08/23/2021   K 3.9 08/23/2021   CO2 19 (L) 08/23/2021   GLUCOSE 65 08/23/2021   BUN 23 (H) 08/23/2021   CREATININE 0.69 08/23/2021   BILITOT 0.3 08/23/2021   ALKPHOS 75 08/23/2021   AST 22 08/23/2021   ALT 26 08/23/2021   PROT 6.8 08/23/2021   ALBUMIN 5.1 (H) 08/23/2021   CALCIUM 9.3 08/23/2021   ANIONGAP 5 05/17/2015   EGFR 115 08/23/2021   Lab Results  Component Value Date   CHOL 185 08/23/2021   Lab Results  Component Value Date   HDL 65 08/23/2021   Lab Results  Component Value Date   LDLCALC 108 (H) 08/23/2021   Lab Results  Component Value Date   TRIG 66 08/23/2021   Lab Results   Component Value Date   CHOLHDL 2.8 08/23/2021   Lab Results  Component Value Date   HGBA1C 5.4 08/23/2021      Assessment & Plan:  1. Encounter for general adult medical examination with abnormal findings Annual wellness visit today.    2. Viral rash Improved and nearly resolved.  Suspect atypical presentation of shingles.  Completed valacyclovir.  Patient to contact office if worsening or persistent symptoms occur.  3. 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 Patient has 5 to 6 mm mole type lesion in center of her throat.  Has been changing over the past few months.  Will refer to dermatology for further evaluation and treatment. - Ambulatory referral to Dermatology  4. Body mass index (BMI) of 40.1-44.9 in adult [facility]) Recommend patient limit calorie intake to 1500 cal/day.  She should consume a low-fat, low-cholesterol diet.  She should gradually incorporate exercise into her daily routine.  5. Healthcare maintenance Routine, fasting labs drawn during today's visit. - T4, free - T3 - Vitamin D (25 hydroxy) - Lipid panel - Comp Met (CMET) - CBC with Differential/Platelet - Hemoglobin A1c - TSH   Problem List Items Addressed This Visit       Musculoskeletal and Integument   Viral rash   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   Relevant Orders   Ambulatory referral to Dermatology     Other   Encounter for general adult medical examination with abnormal findings - Primary   Body mass index (BMI) of 40.1-44.9 in adult [facility])   Other Visit Diagnoses     Healthcare maintenance  Relevant Orders   T4, free   T3   Vitamin D (25 hydroxy)   Lipid panel (Completed)   Comp Met (CMET) (Completed)   CBC with Differential/Platelet (Completed)   Hemoglobin A1c (Completed)   TSH (Completed)       This note was dictated using Dragon Voice Recognition Software. Rapid proofreading was performed to expedite the delivery of the information. Despite  proofreading, phonetic errors will occur which are common with this voice recognition software. Please take this into consideration. If there are any concerns, please contact our office.     Follow-up: Return in about 1 year (around 08/23/2022) for physical and fasting blood work a week before.    Ronnell Freshwater, NP

## 2021-08-24 LAB — CBC WITH DIFFERENTIAL/PLATELET
Basophils Absolute: 0.1 10*3/uL (ref 0.0–0.2)
Basos: 1 %
EOS (ABSOLUTE): 0.2 10*3/uL (ref 0.0–0.4)
Eos: 4 %
Hematocrit: 40.8 % (ref 34.0–46.6)
Hemoglobin: 13.3 g/dL (ref 11.1–15.9)
Immature Grans (Abs): 0 10*3/uL (ref 0.0–0.1)
Immature Granulocytes: 0 %
Lymphocytes Absolute: 2.4 10*3/uL (ref 0.7–3.1)
Lymphs: 39 %
MCH: 27.1 pg (ref 26.6–33.0)
MCHC: 32.6 g/dL (ref 31.5–35.7)
MCV: 83 fL (ref 79–97)
Monocytes Absolute: 0.4 10*3/uL (ref 0.1–0.9)
Monocytes: 6 %
Neutrophils Absolute: 3.1 10*3/uL (ref 1.4–7.0)
Neutrophils: 50 %
Platelets: 381 10*3/uL (ref 150–450)
RBC: 4.91 x10E6/uL (ref 3.77–5.28)
RDW: 15.8 % — ABNORMAL HIGH (ref 11.7–15.4)
WBC: 6.3 10*3/uL (ref 3.4–10.8)

## 2021-08-24 LAB — SPECIMEN STATUS REPORT

## 2021-08-24 LAB — LIPID PANEL
Chol/HDL Ratio: 2.8 ratio (ref 0.0–4.4)
Cholesterol, Total: 185 mg/dL (ref 100–199)
HDL: 65 mg/dL (ref >39)
LDL Chol Calc (NIH): 108 mg/dL — ABNORMAL HIGH (ref 0–99)
Triglycerides: 66 mg/dL (ref 0–149)
VLDL Cholesterol Cal: 12 mg/dL (ref 5–40)

## 2021-08-24 LAB — HEMOGLOBIN A1C
Est. average glucose Bld gHb Est-mCnc: 108 mg/dL
Hgb A1c MFr Bld: 5.4 % (ref 4.8–5.6)

## 2021-08-25 ENCOUNTER — Telehealth: Payer: Self-pay | Admitting: Nurse Practitioner

## 2021-08-25 LAB — COMPREHENSIVE METABOLIC PANEL
ALT: 26 IU/L (ref 0–32)
AST: 22 IU/L (ref 0–40)
Albumin/Globulin Ratio: 3 — ABNORMAL HIGH (ref 1.2–2.2)
Albumin: 5.1 g/dL — ABNORMAL HIGH (ref 3.8–4.8)
Alkaline Phosphatase: 75 IU/L (ref 44–121)
BUN/Creatinine Ratio: 33 — ABNORMAL HIGH (ref 9–23)
BUN: 23 mg/dL — ABNORMAL HIGH (ref 6–20)
Bilirubin Total: 0.3 mg/dL (ref 0.0–1.2)
CO2: 19 mmol/L — ABNORMAL LOW (ref 20–29)
Calcium: 9.3 mg/dL (ref 8.7–10.2)
Chloride: 103 mmol/L (ref 96–106)
Creatinine, Ser: 0.69 mg/dL (ref 0.57–1.00)
Globulin, Total: 1.7 g/dL (ref 1.5–4.5)
Glucose: 65 mg/dL (ref 65–99)
Potassium: 3.9 mmol/L (ref 3.5–5.2)
Sodium: 138 mmol/L (ref 134–144)
Total Protein: 6.8 g/dL (ref 6.0–8.5)
eGFR: 115 mL/min/{1.73_m2} (ref >59)

## 2021-08-25 LAB — SPECIMEN STATUS REPORT

## 2021-08-25 LAB — VITAMIN D 25 HYDROXY (VIT D DEFICIENCY, FRACTURES): Vit D, 25-Hydroxy: 35.2 ng/mL (ref 30.0–100.0)

## 2021-08-25 LAB — T4, FREE: Free T4: 0.78 ng/dL — ABNORMAL LOW (ref 0.82–1.77)

## 2021-08-25 LAB — TSH: TSH: 5.44 u[IU]/mL — ABNORMAL HIGH (ref 0.450–4.500)

## 2021-08-25 LAB — T3: T3, Total: 94 ng/dL (ref 71–180)

## 2021-08-25 NOTE — Telephone Encounter (Signed)
Patient would like to know her results from labs. AS, CMA

## 2021-08-25 NOTE — Progress Notes (Signed)
Not all labs back. Patiently mildly hypothyroid. Will start low dose levothyroxine.

## 2021-08-26 ENCOUNTER — Other Ambulatory Visit: Payer: Self-pay | Admitting: Nurse Practitioner

## 2021-08-26 DIAGNOSIS — E039 Hypothyroidism, unspecified: Secondary | ICD-10-CM

## 2021-08-26 MED ORDER — LEVOTHYROXINE SODIUM 50 MCG PO TABS: 50.0000 ug | ORAL_TABLET | Freq: Every day | ORAL | 1 refills | Status: DC

## 2021-08-26 NOTE — Progress Notes (Signed)
Reviewed labs.patient mildly hypothyroid. Added levothyroxine 2mg daily on empty stomach. May be contributing to fatigue and difficulty with weight loss. Will want to recheck thyroid panel in 3 months. Her LDL was mildly elevated at 108 with the remainder of her lipid panel being normal. She should limit the amount of fat and carbohydrates in her diet and gradually add exercise into her daiy routine. All other labs wee good.

## 2021-08-26 NOTE — Telephone Encounter (Signed)
Called pt she is aware of labs results and recommendation

## 2021-08-26 NOTE — Telephone Encounter (Signed)
Please let the patient know that I have reviewed labs. She is mildly hypothyroid. Added levothyroxine 89mg daily on empty stomach. This may be contributing to fatigue and difficulty with weight loss. I will Will want to recheck thyroid panel in 3 months. She may need to make a lab appointment for this. Her LDL was mildly elevated at 108 with the remainder of her lipid panel being normal. She should limit the amount of fat and carbohydrates in her diet and gradually add exercise into her daiy routine. All other labs wee good. Thanks so much.   -HB

## 2021-08-26 NOTE — Progress Notes (Signed)
Reviewed labs.patient mildly hypothyroid. Added levothyroxine 4mg daily on empty stomach. May be contributing to fatigue and difficulty with weight loss. Will want to recheck thyroid panel in 3 months. Her LDL was mildly elevated at 108 with the remainder of her lipid panel being normal. She should limit the amount of fat and carbohydrates in her diet and gradually add exercise into her daiy routine. All other labs wee good.

## 2021-08-29 ENCOUNTER — Encounter: Payer: Self-pay | Admitting: Nurse Practitioner

## 2021-08-29 DIAGNOSIS — Z0001 Encounter for general adult medical examination with abnormal findings: Secondary | ICD-10-CM | POA: Insufficient documentation

## 2021-08-29 DIAGNOSIS — Z6841 Body Mass Index (BMI) 40.0 and over, adult: Secondary | ICD-10-CM | POA: Insufficient documentation

## 2021-08-29 DIAGNOSIS — D485 Neoplasm of uncertain behavior of skin: Secondary | ICD-10-CM | POA: Insufficient documentation

## 2021-08-29 NOTE — Patient Instructions (Signed)
Calorie Counting for Weight Loss Calories are units of energy. Your body needs a certain number of calories from food to keep going throughout the day. When you eat or drink more calories than your body needs, your body stores the extra calories mostly as fat. When you eat or drink fewer calories than your body needs, your body burns fat to get the energy it needs. Calorie counting means keeping track of how many calories you eat and drink each day. Calorie counting can be helpful if you need to lose weight. If you eat fewer calories than your body needs, you should lose weight. Ask your health care provider what a healthy weight is for you. For calorie counting to work, you will need to eat the right number of calories each day to lose a healthy amount of weight per week. A dietitian can help you figure out how many calories you need in a day and will suggest ways to reach your calorie goal. A healthy amount of weight to lose each week is usually 1-2 lb (0.5-0.9 kg). This usually means that your daily calorie intake should be reduced by 500-750 calories. Eating 1,200-1,500 calories a day can help most women lose weight. Eating 1,500-1,800 calories a day can help most men lose weight. What do I need to know about calorie counting? Work with your health care provider or dietitian to determine how many calories you should get each day. To meet your daily calorie goal, you will need to: Find out how many calories are in each food that you would like to eat. Try to do this before you eat. Decide how much of the food you plan to eat. Keep a food log. Do this by writing down what you ate and how many calories it had. To successfully lose weight, it is important to balance calorie counting with a healthy lifestyle that includes regular activity. Where do I find calorie information? The number of calories in a food can be found on a Nutrition Facts label. If a food does not have a Nutrition Facts label, try to  look up the calories online or ask your dietitian for help. Remember that calories are listed per serving. If you choose to have more than one serving of a food, you will have to multiply the calories per serving by the number of servings you plan to eat. For example, the label on a package of bread might say that a serving size is 1 slice and that there are 90 calories in a serving. If you eat 1 slice, you will have eaten 90 calories. If you eat 2 slices, you will have eaten 180 calories. How do I keep a food log? After each time that you eat, record the following in your food log as soon as possible: What you ate. Be sure to include toppings, sauces, and other extras on the food. How much you ate. This can be measured in cups, ounces, or number of items. How many calories were in each food and drink. The total number of calories in the food you ate. Keep your food log near you, such as in a pocket-sized notebook or on an app or website on your mobile phone. Some programs will calculate calories for you and show you how many calories you have left to meet your daily goal. What are some portion-control tips? Know how many calories are in a serving. This will help you know how many servings you can have of a certain food.  Use a measuring cup to measure serving sizes. You could also try weighing out portions on a kitchen scale. With time, you will be able to estimate serving sizes for some foods. Take time to put servings of different foods on your favorite plates or in your favorite bowls and cups so you know what a serving looks like. Try not to eat straight from a food's packaging, such as from a bag or box. Eating straight from the package makes it hard to see how much you are eating and can lead to overeating. Put the amount you would like to eat in a cup or on a plate to make sure you are eating the right portion. Use smaller plates, glasses, and bowls for smaller portions and to prevent  overeating. Try not to multitask. For example, avoid watching TV or using your computer while eating. If it is time to eat, sit down at a table and enjoy your food. This will help you recognize when you are full. It will also help you be more mindful of what and how much you are eating. What are tips for following this plan? Reading food labels Check the calorie count compared with the serving size. The serving size may be smaller than what you are used to eating. Check the source of the calories. Try to choose foods that are high in protein, fiber, and vitamins, and low in saturated fat, trans fat, and sodium. Shopping Read nutrition labels while you shop. This will help you make healthy decisions about which foods to buy. Pay attention to nutrition labels for low-fat or fat-free foods. These foods sometimes have the same number of calories or more calories than the full-fat versions. They also often have added sugar, starch, or salt to make up for flavor that was removed with the fat. Make a grocery list of lower-calorie foods and stick to it. Cooking Try to cook your favorite foods in a healthier way. For example, try baking instead of frying. Use low-fat dairy products. Meal planning Use more fruits and vegetables. One-half of your plate should be fruits and vegetables. Include lean proteins, such as chicken, [place], and fish. Lifestyle Each week, aim to do one of the following: 150 minutes of moderate exercise, such as walking. 75 minutes of vigorous exercise, such as running. General information Know how many calories are in the foods you eat most often. This will help you calculate calorie counts faster. Find a way of tracking calories that works for you. Get creative. Try different apps or programs if writing down calories does not work for you. What foods should I eat?  Eat nutritious foods. It is better to have a nutritious, high-calorie food, such as an avocado, than a food with  few nutrients, such as a bag of potato chips. Use your calories on foods and drinks that will fill you up and will not leave you hungry soon after eating. Examples of foods that fill you up are nuts and nut butters, vegetables, lean proteins, and high-fiber foods such as whole grains. High-fiber foods are foods with more than 5 g of fiber per serving. Pay attention to calories in drinks. Low-calorie drinks include water and unsweetened drinks. The items listed above may not be a complete list of foods and beverages you can eat. Contact a dietitian for more information. What foods should I limit? Limit foods or drinks that are not good sources of vitamins, minerals, or protein or that are high in unhealthy fats. These include:  Candy. Other sweets. Sodas, specialty coffee drinks, alcohol, and juice. The items listed above may not be a complete list of foods and beverages you should avoid. Contact a dietitian for more information. How do I count calories when eating out? Pay attention to portions. Often, portions are much larger when eating out. Try these tips to keep portions smaller: Consider sharing a meal instead of getting your own. If you get your own meal, eat only half of it. Before you start eating, ask for a container and put half of your meal into it. When available, consider ordering smaller portions from the menu instead of full portions. Pay attention to your food and drink choices. Knowing the way food is cooked and what is included with the meal can help you eat fewer calories. If calories are listed on the menu, choose the lower-calorie options. Choose dishes that include vegetables, fruits, whole grains, low-fat dairy products, and lean proteins. Choose items that are boiled, broiled, grilled, or steamed. Avoid items that are buttered, battered, fried, or served with cream sauce. Items labeled as crispy are usually fried, unless stated otherwise. Choose water, low-fat milk,  unsweetened iced tea, or other drinks without added sugar. If you want an alcoholic beverage, choose a lower-calorie option, such as a glass of wine or light beer. Ask for dressings, sauces, and syrups on the side. These are usually high in calories, so you should limit the amount you eat. If you want a salad, choose a garden salad and ask for grilled meats. Avoid extra toppings such as bacon, cheese, or fried items. Ask for the dressing on the side, or ask for olive oil and vinegar or lemon to use as dressing. Estimate how many servings of a food you are given. Knowing serving sizes will help you be aware of how much food you are eating at restaurants. Where to find more information Centers for Disease Control and Prevention: http://www.wolf.info/ U.[place]. Department of Agriculture: http://www.wilson-mendoza.org/ Summary Calorie counting means keeping track of how many calories you eat and drink each day. If you eat fewer calories than your body needs, you should lose weight. A healthy amount of weight to lose per week is usually 1-2 lb (0.5-0.9 kg). This usually means reducing your daily calorie intake by 500-750 calories. The number of calories in a food can be found on a Nutrition Facts label. If a food does not have a Nutrition Facts label, try to look up the calories online or ask your dietitian for help. Use smaller plates, glasses, and bowls for smaller portions and to prevent overeating. Use your calories on foods and drinks that will fill you up and not leave you hungry shortly after a meal. This information is not intended to replace advice given to you by your health care provider. Make sure you discuss any questions you have with your health care provider. Document Revised: 01/15/2020 Document Reviewed: 01/15/2020 Elsevier Patient Education  2022 [place] and Cholesterol Restricted Eating Plan Getting too much fat and cholesterol in your diet may cause health problems. Choosing the right foods helps keep  your fat and cholesterol at normal levels. This can keep you from getting certain diseases. Your doctor may recommend an eating plan that includes: Total fat: ______% or less of total calories a day. Saturated fat: ______% or less of total calories a day. Cholesterol: less than _________mg a day. Fiber: ______g a day. What are tips for following this plan? Meal planning At meals, divide your plate  into four equal parts: Fill one-half of your plate with vegetables and green salads. Fill one-fourth of your plate with whole grains. Fill one-fourth of your plate with low-fat (lean) protein foods. Eat fish that is high in omega-3 fats at least two times a week. This includes mackerel, tuna, sardines, and salmon. Eat foods that are high in fiber, such as whole grains, beans, apples, broccoli, carrots, peas, and barley. General tips  Work with your doctor to lose weight if you need to. Avoid: Foods with added sugar. Fried foods. Foods with partially hydrogenated oils. Limit alcohol intake to no more than 1 drink a day for nonpregnant women and 2 drinks a day for men. One drink equals 12 oz of beer, 5 oz of wine, or 1 oz of hard liquor. Reading food labels Check food labels for: Trans fats. Partially hydrogenated oils. Saturated fat (g) in each serving. Cholesterol (mg) in each serving. Fiber (g) in each serving. Choose foods with healthy fats, such as: Monounsaturated fats. Polyunsaturated fats. Omega-3 fats. Choose grain products that have whole grains. Look for the word "whole" as the first word in the ingredient list. Cooking Cook foods using low-fat methods. These include baking, boiling, grilling, and broiling. Eat more home-cooked foods. Eat at restaurants and buffets less often. Avoid cooking using saturated fats, such as butter, cream, palm oil, palm kernel oil, and coconut oil. Recommended foods Fruits All fresh, canned (in natural juice), or frozen  fruits. Vegetables Fresh or frozen vegetables (raw, steamed, roasted, or grilled). Green salads. Grains Whole grains, such as whole wheat or whole grain breads, crackers, cereals, and pasta. Unsweetened oatmeal, bulgur, barley, quinoa, or brown rice. Corn or whole wheat flour tortillas. Meats and other protein foods Ground beef (85% or leaner), grass-fed beef, or beef trimmed of fat. Skinless chicken or [place]. Ground chicken or [place]. Pork trimmed of fat. All fish and seafood. Egg whites. Dried beans, peas, or lentils. Unsalted nuts or seeds. Unsalted canned beans. Nut butters without added sugar or oil. Dairy Low-fat or nonfat dairy products, such as skim or 1% milk, 2% or reduced-fat cheeses, low-fat and fat-free ricotta or cottage cheese, or plain low-fat and nonfat yogurt. Fats and oils Tub margarine without trans fats. Light or reduced-fat mayonnaise and salad dressings. Avocado. Olive, canola, sesame, or safflower oils. The items listed above may not be a complete list of foods and beverages you can eat. Contact a dietitian for more information. Foods to avoid Fruits Canned fruit in heavy syrup. Fruit in cream or butter sauce. Fried fruit. Vegetables Vegetables cooked in cheese, cream, or butter sauce. Fried vegetables. Grains White bread. White pasta. White rice. Cornbread. Bagels, pastries, and croissants. Crackers and snack foods that contain trans fat and hydrogenated oils. Meats and other protein foods Fatty cuts of meat. Ribs, chicken wings, bacon, sausage, bologna, salami, chitterlings, fatback, hot dogs, bratwurst, and packaged lunch meats. Liver and organ meats. Whole eggs and egg yolks. Chicken and [place] with skin. Fried meat. Dairy Whole or 2% milk, cream, half-and-half, and cream cheese. Whole milk cheeses. Whole-fat or sweetened yogurt. Full-fat cheeses. Nondairy creamers and whipped toppings. Processed cheese, cheese spreads, and cheese curds. Beverages Alcohol.  Sugar-sweetened drinks such as sodas, lemonade, and fruit drinks. Fats and oils Butter, stick margarine, lard, shortening, ghee, or bacon fat. Coconut, palm kernel, and palm oils. Sweets and desserts Corn syrup, sugars, honey, and molasses. Candy. Jam and jelly. Syrup. Sweetened cereals. Cookies, pies, cakes, donuts, muffins, and ice cream. The items listed above  may not be a complete list of foods and beverages you should avoid. Contact a dietitian for more information. Summary Choosing the right foods helps keep your fat and cholesterol at normal levels. This can keep you from getting certain diseases. At meals, fill one-half of your plate with vegetables and green salads. Eat high-fiber foods, like whole grains, beans, apples, carrots, peas, and barley. Limit added sugar, saturated fats, alcohol, and fried foods. This information is not intended to replace advice given to you by your health care provider. Make sure you discuss any questions you have with your health care provider. Document Revised: 04/07/2020 Document Reviewed: 04/07/2020 Elsevier Patient Education  2022 [employer].

## 2021-08-30 NOTE — Telephone Encounter (Signed)
Called pt she is advised of who to called concerning her Insurance card Tonya Chan

## 2021-09-02 ENCOUNTER — Encounter: Payer: Self-pay | Admitting: Nurse Practitioner

## 2021-09-05 ENCOUNTER — Other Ambulatory Visit: Payer: Self-pay | Admitting: Nurse Practitioner

## 2021-09-05 DIAGNOSIS — E039 Hypothyroidism, unspecified: Secondary | ICD-10-CM

## 2021-09-05 MED ORDER — LEVOTHYROXINE SODIUM 50 MCG PO TABS: 50.0000 ug | ORAL_TABLET | Freq: Every day | ORAL | 1 refills | Status: DC

## 2021-09-05 NOTE — Progress Notes (Signed)
Rsent new prescription for levothyroxine to [facility] home delivery

## 2021-11-23 ENCOUNTER — Other Ambulatory Visit: Payer: Self-pay | Admitting: Nurse Practitioner

## 2021-11-23 DIAGNOSIS — E039 Hypothyroidism, unspecified: Secondary | ICD-10-CM

## 2021-11-24 ENCOUNTER — Other Ambulatory Visit: Payer: BC Managed Care – PPO

## 2021-11-24 ENCOUNTER — Other Ambulatory Visit: Payer: Self-pay

## 2021-11-24 DIAGNOSIS — E039 Hypothyroidism, unspecified: Secondary | ICD-10-CM | POA: Diagnosis not present

## 2021-11-25 ENCOUNTER — Encounter: Payer: Self-pay | Admitting: Nurse Practitioner

## 2021-11-25 LAB — T4, FREE: Free T4: 0.98 ng/dL (ref 0.82–1.77)

## 2021-11-25 LAB — TSH: TSH: 1.91 u[IU]/mL (ref 0.450–4.500)

## 2021-11-25 LAB — T3: T3, Total: 104 ng/dL (ref 71–180)

## 2021-11-28 ENCOUNTER — Other Ambulatory Visit: Payer: Self-pay | Admitting: Nurse Practitioner

## 2021-11-28 DIAGNOSIS — E039 Hypothyroidism, unspecified: Secondary | ICD-10-CM

## 2021-11-28 MED ORDER — LEVOTHYROXINE SODIUM 50 MCG PO TABS: 50.0000 ug | ORAL_TABLET | Freq: Every day | ORAL | 1 refills | Status: DC

## 2021-11-28 NOTE — Progress Notes (Signed)
Thyroid panel is normal. Will continue levothyroxine at 27mcg daily. A [facility]. MyChart message sent to patient.

## 2022-02-28 ENCOUNTER — Other Ambulatory Visit: Payer: Self-pay

## 2022-02-28 ENCOUNTER — Ambulatory Visit (INDEPENDENT_AMBULATORY_CARE_PROVIDER_SITE_OTHER): Payer: BC Managed Care – PPO | Admitting: Dermatology

## 2022-02-28 DIAGNOSIS — D1801 Hemangioma of skin and subcutaneous tissue: Secondary | ICD-10-CM

## 2022-02-28 DIAGNOSIS — L821 Other seborrheic keratosis: Secondary | ICD-10-CM | POA: Diagnosis not present

## 2022-02-28 DIAGNOSIS — D225 Melanocytic nevi of trunk: Secondary | ICD-10-CM | POA: Diagnosis not present

## 2022-02-28 DIAGNOSIS — Z1283 Encounter for screening for malignant neoplasm of skin: Secondary | ICD-10-CM | POA: Diagnosis not present

## 2022-02-28 DIAGNOSIS — D485 Neoplasm of uncertain behavior of skin: Secondary | ICD-10-CM

## 2022-02-28 NOTE — Patient Instructions (Signed)

## 2022-03-03 ENCOUNTER — Other Ambulatory Visit: Payer: Self-pay | Admitting: Nurse Practitioner

## 2022-03-03 ENCOUNTER — Encounter: Payer: Self-pay | Admitting: Nurse Practitioner

## 2022-03-03 DIAGNOSIS — E039 Hypothyroidism, unspecified: Secondary | ICD-10-CM

## 2022-03-06 ENCOUNTER — Other Ambulatory Visit: Payer: Self-pay

## 2022-03-06 DIAGNOSIS — E039 Hypothyroidism, unspecified: Secondary | ICD-10-CM

## 2022-03-06 NOTE — Telephone Encounter (Signed)
Can we call her to schedule a lab appointment to check TSH, T3, and Free T4?  ?Thanks so much.   -HB

## 2022-03-09 ENCOUNTER — Other Ambulatory Visit: Payer: Self-pay

## 2022-03-09 ENCOUNTER — Other Ambulatory Visit: Payer: BC Managed Care – PPO

## 2022-03-09 DIAGNOSIS — E039 Hypothyroidism, unspecified: Secondary | ICD-10-CM

## 2022-03-10 LAB — T3: T3, Total: 120 ng/dL (ref 71–180)

## 2022-03-10 LAB — T4, FREE: Free T4: 1.15 ng/dL (ref 0.82–1.77)

## 2022-03-10 LAB — TSH: TSH: 1.72 u[IU]/mL (ref 0.450–4.500)

## 2022-03-12 ENCOUNTER — Encounter: Payer: Self-pay | Admitting: Dermatology

## 2022-03-12 NOTE — Progress Notes (Signed)
? ?  New Patient ?  ?Subjective  ?Tonya Chan is a 36 y.o. female who presents for the following: New Patient (Initial Visit) (Pt here to have a mole on the L neck that has gotten bigger and has grown hair. Pt has a spot on the back the thinks has gotten bigger. ). ? ?2 moles have changed (neck and back).  General skin check ?Location:  ?Duration:  ?Quality:  ?Associated Signs/Symptoms: ?Modifying Factors:  ?Severity:  ?Timing: ?Context:  ? ? ?The following portions of the chart were reviewed this encounter and updated as appropriate:  Tobacco  Allergies  Meds  Problems  Med Hx  Surg Hx  Fam Hx   ?  ? ?Objective  ?Well appearing patient in no apparent distress; mood and affect are within normal limits. ?Torso - Posterior (Back) ?General skin examination: 2 moles with history of change will be biopsied.  No other suspicious lesions. ? ?Torso - Posterior (Back) ?Several 1 mm smooth red dermal papules ? ?Torso - Posterior (Back) ?6 mm brown flattopped keratotic papule, compatible dermoscopy ? ?Chest - Medial [facility]) ?6 mm slightly inflamed pearly brown papule ? ? ? ? ? ? ?Right Lower Back ?Oval flesh-colored 8 mm domed papule ? ? ? ? ? ? ? ? ?A full examination was performed including scalp, head, eyes, ears, nose, lips, neck, chest, axillae, abdomen, back, buttocks, bilateral upper extremities, bilateral lower extremities, hands, feet, fingers, toes, fingernails, and toenails. All findings within normal limits unless otherwise noted below.  Areas beneath undergarments not fully examined ? ? ?Assessment & Plan  ?Screening exam for skin cancer ?Torso - Posterior (Back) ? ?Annual skin examination, encouraged to self examine with spouse twice annually.  Continue ultraviolet protection. ? ?Cherry angioma ?Torso - Posterior (Back) ? ?No intervention necessary ? ?Seborrheic keratosis ?Torso - Posterior (Back) ? ?Leave if stable ? ?Neoplasm of uncertain behavior of skin (2) ?Chest - Medial [facility]) ? ?Skin / nail  biopsy ?Type of biopsy: tangential   ?Informed consent: discussed and consent obtained   ?Timeout: patient name, date of birth, surgical site, and procedure verified   ?Anesthesia: the lesion was anesthetized in a standard fashion   ?Anesthetic:  1% lidocaine w/ epinephrine 1-100,000 local infiltration ?Instrument used: flexible razor blade   ?Hemostasis achieved with: ferric subsulfate and electrodesiccation   ?Outcome: patient tolerated procedure well   ? ?Specimen 1 - Surgical pathology ?Differential Diagnosis: R/O BCC VS SCC ? ?Check Margins: No ? ?Right Lower Back ? ?Skin / nail biopsy ?Type of biopsy: tangential   ?Informed consent: discussed and consent obtained   ?Timeout: patient name, date of birth, surgical site, and procedure verified   ?Anesthesia: the lesion was anesthetized in a standard fashion   ?Anesthetic:  1% lidocaine w/ epinephrine 1-100,000 local infiltration ?Instrument used: flexible razor blade   ?Hemostasis achieved with: ferric subsulfate and electrodesiccation   ?Outcome: patient tolerated procedure well   ?Post-procedure details: wound care instructions given   ? ?Specimen 2 - Surgical pathology ?Differential Diagnosis: R/O BCC VS SCC ? ?Check Margins: No ? ? ?

## 2022-04-16 NOTE — Progress Notes (Signed)
Normal thyroid panel. Discuss at next visit .

## 2022-05-08 ENCOUNTER — Ambulatory Visit: Payer: BC Managed Care – PPO | Admitting: Dermatology

## 2022-09-13 ENCOUNTER — Other Ambulatory Visit: Payer: Self-pay | Admitting: Nurse Practitioner

## 2022-09-13 ENCOUNTER — Other Ambulatory Visit: Payer: BC Managed Care – PPO

## 2022-09-13 DIAGNOSIS — E559 Vitamin D deficiency, unspecified: Secondary | ICD-10-CM

## 2022-09-13 DIAGNOSIS — E039 Hypothyroidism, unspecified: Secondary | ICD-10-CM

## 2022-09-13 DIAGNOSIS — Z0000 Encounter for general adult medical examination without abnormal findings: Secondary | ICD-10-CM | POA: Diagnosis not present

## 2022-09-14 LAB — COMPREHENSIVE METABOLIC PANEL
ALT: 17 IU/L (ref 0–32)
AST: 15 IU/L (ref 0–40)
Albumin/Globulin Ratio: 2.3 — ABNORMAL HIGH (ref 1.2–2.2)
Albumin: 4.6 g/dL (ref 3.9–4.9)
Alkaline Phosphatase: 82 IU/L (ref 44–121)
BUN/Creatinine Ratio: 23 (ref 9–23)
BUN: 14 mg/dL (ref 6–20)
Bilirubin Total: 0.4 mg/dL (ref 0.0–1.2)
CO2: 22 mmol/L (ref 20–29)
Calcium: 9.6 mg/dL (ref 8.7–10.2)
Chloride: 104 mmol/L (ref 96–106)
Creatinine, Ser: 0.62 mg/dL (ref 0.57–1.00)
Globulin, Total: 2 g/dL (ref 1.5–4.5)
Glucose: 87 mg/dL (ref 70–99)
Potassium: 4.2 mmol/L (ref 3.5–5.2)
Sodium: 141 mmol/L (ref 134–144)
Total Protein: 6.6 g/dL (ref 6.0–8.5)
eGFR: 118 mL/min/{1.73_m2} (ref >59)

## 2022-09-14 LAB — LIPID PANEL
Chol/HDL Ratio: 3 ratio (ref 0.0–4.4)
Cholesterol, Total: 189 mg/dL (ref 100–199)
HDL: 63 mg/dL (ref >39)
LDL Chol Calc (NIH): 108 mg/dL — ABNORMAL HIGH (ref 0–99)
Triglycerides: 103 mg/dL (ref 0–149)
VLDL Cholesterol Cal: 18 mg/dL (ref 5–40)

## 2022-09-14 LAB — CBC
Hematocrit: 43.4 % (ref 34.0–46.6)
Hemoglobin: 14.5 g/dL (ref 11.1–15.9)
MCH: 29.5 pg (ref 26.6–33.0)
MCHC: 33.4 g/dL (ref 31.5–35.7)
MCV: 88 fL (ref 79–97)
Platelets: 365 10*3/uL (ref 150–450)
RBC: 4.91 x10E6/uL (ref 3.77–5.28)
RDW: 12.2 % (ref 11.7–15.4)
WBC: 5.9 10*3/uL (ref 3.4–10.8)

## 2022-09-14 LAB — HEMOGLOBIN A1C
Est. average glucose Bld gHb Est-mCnc: 111 mg/dL
Hgb A1c MFr Bld: 5.5 % (ref 4.8–5.6)

## 2022-09-14 LAB — TSH+FREE T4
Free T4: 1.04 ng/dL (ref 0.82–1.77)
TSH: 2.37 u[IU]/mL (ref 0.450–4.500)

## 2022-09-14 LAB — VITAMIN D 25 HYDROXY (VIT D DEFICIENCY, FRACTURES): Vit D, 25-Hydroxy: 28.7 ng/mL — ABNORMAL LOW (ref 30.0–100.0)

## 2022-09-14 LAB — T3: T3, Total: 117 ng/dL (ref 71–180)

## 2022-09-15 ENCOUNTER — Other Ambulatory Visit: Payer: Self-pay | Admitting: Nurse Practitioner

## 2022-09-15 ENCOUNTER — Other Ambulatory Visit: Payer: Self-pay

## 2022-09-15 DIAGNOSIS — E039 Hypothyroidism, unspecified: Secondary | ICD-10-CM

## 2022-09-15 MED ORDER — LEVOTHYROXINE SODIUM 50 MCG PO TABS: 50.0000 ug | ORAL_TABLET | Freq: Every day | ORAL | 1 refills | Status: DC

## 2022-09-21 ENCOUNTER — Encounter: Payer: Self-pay | Admitting: Nurse Practitioner

## 2022-09-21 ENCOUNTER — Ambulatory Visit (INDEPENDENT_AMBULATORY_CARE_PROVIDER_SITE_OTHER): Payer: BC Managed Care – PPO | Admitting: Nurse Practitioner

## 2022-09-21 VITALS — BP 110/75 | HR 84 | Ht 61.0 in | Wt 232.8 lb

## 2022-09-21 DIAGNOSIS — Z0001 Encounter for general adult medical examination with abnormal findings: Secondary | ICD-10-CM

## 2022-09-21 DIAGNOSIS — E039 Hypothyroidism, unspecified: Secondary | ICD-10-CM | POA: Diagnosis not present

## 2022-09-21 DIAGNOSIS — E559 Vitamin D deficiency, unspecified: Secondary | ICD-10-CM

## 2022-09-21 DIAGNOSIS — Z6841 Body Mass Index (BMI) 40.0 and over, adult: Secondary | ICD-10-CM | POA: Diagnosis not present

## 2022-09-21 DIAGNOSIS — Z23 Encounter for immunization: Secondary | ICD-10-CM | POA: Diagnosis not present

## 2022-09-21 MED ORDER — ERGOCALCIFEROL 1.25 MG (50000 UT) PO CAPS: 50000.0000 [IU] | ORAL_CAPSULE | ORAL | 1 refills | Status: DC

## 2022-09-21 MED ORDER — LEVOTHYROXINE SODIUM 50 MCG PO TABS: 50.0000 ug | ORAL_TABLET | Freq: Every day | ORAL | 1 refills | Status: DC

## 2022-09-21 NOTE — Progress Notes (Signed)
Complete physical exam   Patient: Tonya Chan   DOB: Jan 21, 1986   36 y.o. Female  MRN: [id number] Visit Date: 09/21/2022    Chief Complaint  Patient presents with   Annual Exam   Subjective    Tonya Chan is a 37 y.o. female who presents today for a complete physical exam.  She reports consuming a  "not so great"  diet. The patient has a physically strenuous job, but has no regular exercise apart from work.  She generally feels well. She does not have additional problems to discuss today.   HPI  Annual physical  -routine, fasting labs drawn prior to this visit --mild elevation of LDL with lipid panel otherwise normal.  -mild vitamin d deficiency  -other labs normal  -the patient has no physical concerns or complaints today   Past Medical History:  Diagnosis Date   History of postpartum hemorrhage, currently pregnant    No pertinent past medical history    Past Surgical History:  Procedure Laterality Date   TYMPANOSTOMY TUBE PLACEMENT Bilateral    Social History   Socioeconomic History   Marital status: Married    Spouse name: Not on file   Number of children: Not on file   Years of education: Not on file   Highest education level: Not on file  Occupational History   Not on file  Tobacco Use   Smoking status: Former    Types: Cigarettes    Quit date: 03/18/2014    Years since quitting: 8.5   Smokeless tobacco: Former    Quit date: 01/19/2012  Vaping Use   Vaping Use: Never used  Substance and Sexual Activity   Alcohol use: No   Drug use: No   Sexual activity: Yes  Other Topics Concern   Not on file  Social History Narrative   Not on file   Social Determinants of Health   Financial Resource Strain: Low Risk  (02/03/2019)   Overall Financial Resource Strain (CARDIA)    Difficulty of Paying Living Expenses: Not hard at all  Food Insecurity: No Food Insecurity (02/03/2019)   Hunger Vital Sign    Worried About Running Out of Food in the Last Year: Never  true    [place] in the Last Year: Never true  Transportation Needs: Unknown (02/03/2019)   PRAPARE - Hydrologist (Medical): No    Lack of Transportation (Non-Medical): Not on file  Physical Activity: Not on file  Stress: Stress Concern Present (02/03/2019)   [place]    Feeling of Stress : Rather much  Social Connections: Not on file  Intimate Partner Violence: Not At Risk (02/03/2019)   Humiliation, Afraid, Rape, and Kick questionnaire    Fear of Current or Ex-Partner: No    Emotionally Abused: No    Physically Abused: No    Sexually Abused: No   Family Status  Relation Name Status   Father  Deceased   Mat Uncle  (Not Specified)   MGM  (Not Specified)   MGF  (Not Specified)   PGF  (Not Specified)   Mother  Alive   Family History  Problem Relation Age of Onset   Hypertension Father    Diabetes Maternal Uncle    Breast cancer Maternal Grandmother    Colon cancer Maternal Grandmother    Thyroid disease Maternal Grandmother    Heart disease Maternal Grandfather    Diabetes Maternal  Grandfather    Bladder Cancer Maternal Grandfather    Stroke Paternal Grandfather    Lupus Mother    Pulmonary Hypertension Mother    No Known Allergies  Patient Care Team: Ronnell Freshwater, NP as PCP - General (Family Medicine)   Medications: Outpatient Medications Prior to Visit  Medication Sig   levonorgestrel (MIRENA, 52 MG,) 20 MCG/DAY IUD Mirena 21 mcg/24 hours (8 yrs) 52 mg intrauterine device  provided by care center   [DISCONTINUED] levothyroxine (SYNTHROID) 50 MCG tablet Take 1 tablet (50 mcg total) by mouth daily.   [DISCONTINUED] Prenatal Vit-Fe Fumarate-FA (PRENATAL MULTIVITAMIN) TABS Take 1 tablet by mouth at bedtime.   [DISCONTINUED] valACYclovir (VALTREX) 1000 MG tablet Take 1 tablet (1,000 mg total) by mouth 2 (two) times daily. (Patient not taking: Reported on 02/28/2022)    No facility-administered medications prior to visit.    Review of Systems  Constitutional:  Negative for activity change, appetite change, chills, fatigue and fever.  HENT:  Negative for congestion, postnasal drip, rhinorrhea, sinus pressure, sinus pain, sneezing and sore throat.   Eyes: Negative.   Respiratory:  Negative for cough, chest tightness, shortness of breath and wheezing.   Cardiovascular:  Negative for chest pain and palpitations.  Gastrointestinal:  Negative for abdominal pain, constipation, diarrhea, nausea and vomiting.  Endocrine: Negative for cold intolerance, heat intolerance, polydipsia and polyuria.       Recent thyroid panel within normal limits   Genitourinary:  Negative for dyspareunia, dysuria, flank pain, frequency and urgency.  Musculoskeletal:  Negative for arthralgias, back pain and myalgias.  Skin:  Negative for rash.  Allergic/Immunologic: Negative for environmental allergies.  Neurological:  Negative for dizziness, weakness and headaches.  Hematological:  Negative for adenopathy.  Psychiatric/Behavioral:  The patient is not nervous/anxious.     Last CBC Lab Results  Component Value Date   WBC 5.9 09/13/2022   HGB 14.5 09/13/2022   HCT 43.4 09/13/2022   MCV 88 09/13/2022   MCH 29.5 09/13/2022   RDW 12.2 09/13/2022   PLT 365 27/25/3664   Last metabolic panel Lab Results  Component Value Date   GLUCOSE 87 09/13/2022   NA 141 09/13/2022   K 4.2 09/13/2022   CL 104 09/13/2022   CO2 22 09/13/2022   BUN 14 09/13/2022   CREATININE 0.62 09/13/2022   EGFR 118 09/13/2022   CALCIUM 9.6 09/13/2022   PROT 6.6 09/13/2022   ALBUMIN 4.6 09/13/2022   LABGLOB 2.0 09/13/2022   AGRATIO 2.3 (H) 09/13/2022   BILITOT 0.4 09/13/2022   ALKPHOS 82 09/13/2022   AST 15 09/13/2022   ALT 17 09/13/2022   ANIONGAP 5 05/17/2015   Last lipids Lab Results  Component Value Date   CHOL 189 09/13/2022   HDL 63 09/13/2022   LDLCALC 108 (H) 09/13/2022   TRIG 103  09/13/2022   CHOLHDL 3.0 09/13/2022   Last hemoglobin A1c Lab Results  Component Value Date   HGBA1C 5.5 09/13/2022   Last thyroid functions Lab Results  Component Value Date   TSH 2.370 09/13/2022   T3TOTAL 117 09/13/2022   Last vitamin D Lab Results  Component Value Date   VD25OH 28.7 (L) 09/13/2022       Objective     Today's Vitals   09/21/22 1315  BP: 110/75  Pulse: 84  SpO2: 97%  Weight: 232 lb 12.8 oz (105.6 kg)  Height: '5\' 1"'  (1.549 m)   Body mass index is 43.99 kg/m.   BP Readings from Last 3  Encounters:  09/21/22 110/75  08/23/21 113/79  08/10/21 115/76    Wt Readings from Last 3 Encounters:  09/21/22 232 lb 12.8 oz (105.6 kg)  08/23/21 220 lb 1.6 oz (99.8 kg)  08/10/21 223 lb 4.8 oz (101.3 kg)     Physical Exam Vitals and nursing note reviewed.  Constitutional:      Appearance: Normal appearance. She is well-developed. She is obese.  HENT:     Head: Normocephalic and atraumatic.     Right Ear: Tympanic membrane, ear canal and external ear normal.     Left Ear: Tympanic membrane, ear canal and external ear normal.     Nose: Nose normal.     Mouth/Throat:     Mouth: Mucous membranes are moist.     Pharynx: Oropharynx is clear.  Eyes:     Extraocular Movements: Extraocular movements intact.     Conjunctiva/sclera: Conjunctivae normal.     Pupils: Pupils are equal, round, and reactive to light.  Cardiovascular:     Rate and Rhythm: Normal rate and regular rhythm.     Pulses: Normal pulses.     Heart sounds: Normal heart sounds.  Pulmonary:     Effort: Pulmonary effort is normal.     Breath sounds: Normal breath sounds.  Abdominal:     General: Bowel sounds are normal. There is no distension.     Palpations: Abdomen is soft. There is no mass.     Tenderness: There is no abdominal tenderness. There is no right CVA tenderness, left CVA tenderness, guarding or rebound.     Hernia: No hernia is present.  Musculoskeletal:        General:  Normal range of motion.     Cervical back: Normal range of motion and neck supple.  Lymphadenopathy:     Cervical: No cervical adenopathy.  Skin:    General: Skin is warm and dry.     Capillary Refill: Capillary refill takes less than 2 seconds.  Neurological:     General: No focal deficit present.     Mental Status: She is alert and oriented to person, place, and time.  Psychiatric:        Mood and Affect: Mood normal.        Behavior: Behavior normal.        Thought Content: Thought content normal.        Judgment: Judgment normal.     Last depression screening scores   Row Labels 09/21/2022    1:41 PM 08/23/2021    4:50 PM  PHQ 2/9 Scores   Section Header. No data exists in this row.    PHQ - 2 Score   0 0  PHQ- 9 Score   0 0    Assessment & Plan     1. Encounter for general adult medical examination with abnormal findings Annual physical today   2. Acquired hypothyroidism Thyroid panel stable. Continue levothyroxine 50 mcg daily. New prescription sent to mail order pharmacy today  - levothyroxine (SYNTHROID) 50 MCG tablet; Take 1 tablet (50 mcg total) by mouth daily.  Dispense: 90 tablet; Refill: 1  3. Vitamin D deficiency disease Start drisdol 50000 iu weekly. Prescription sent to mail order pharmacy today  - ergocalciferol (DRISDOL) 1.25 MG (50000 UT) capsule; Take 1 capsule (50,000 Units total) by mouth once a week.  Dispense: 12 capsule; Refill: 1  4. BMI 40.0-44.9, adult (HCC) Discussed lowering calorie intake to 1500 calories per day and incorporating exercise into daily routine to  help lose weight.   5. Need for influenza vaccination Flu vaccine adminstered during today's visit  - Flu Vaccine QUAD 6+ mos PF IM (Fluarix Quad PF)   Immunization History  Administered Date(s) Administered   Influenza Inj Mdck Quad Pf 11/08/2020   Influenza,inj,Quad PF,6+ Mos 09/21/2022   Influenza-Unspecified 09/25/2012   Moderna SARS-COV2 Booster Vaccination 07/21/2021    Moderna Sars-Covid-2 Vaccination 05/21/2020, 06/18/2020   Tdap 07/22/2012, 01/31/2021    Health Maintenance  Topic Date Due   Hepatitis C Screening  Never done   COVID-19 Vaccine (3 - Moderna risk series) 08/18/2021   PAP SMEAR-Modifier  10/14/2023   TETANUS/TDAP  01/31/2031   INFLUENZA VACCINE  Completed   HIV Screening  Completed   HPV VACCINES  Aged Out    Discussed health benefits of physical activity, and encouraged her to engage in regular exercise appropriate for her age and condition.  Problem List Items Addressed This Visit       Endocrine   Acquired hypothyroidism   Relevant Medications   levothyroxine (SYNTHROID) 50 MCG tablet     Other   Encounter for general adult medical examination with abnormal findings - Primary   BMI 40.0-44.9, adult ([place])   Vitamin D deficiency disease   Relevant Medications   ergocalciferol (DRISDOL) 1.25 MG (50000 UT) capsule   Other Visit Diagnoses     Need for influenza vaccination       Relevant Orders   Flu Vaccine QUAD 6+ mos PF IM (Fluarix Quad PF) (Completed)        Return in about 1 year (around 09/22/2023) for health maintenance exam, FBW a week prior to visit - 6 mos lab check with TSH, T#, and Free t4 .        Ronnell Freshwater, NP  [facility] [phone] (phone) [phone] (fax)  [place]

## 2022-12-01 DIAGNOSIS — Z6841 Body Mass Index (BMI) 40.0 and over, adult: Secondary | ICD-10-CM | POA: Diagnosis not present

## 2022-12-01 DIAGNOSIS — Z01419 Encounter for gynecological examination (general) (routine) without abnormal findings: Secondary | ICD-10-CM | POA: Diagnosis not present

## 2022-12-01 DIAGNOSIS — D649 Anemia, unspecified: Secondary | ICD-10-CM | POA: Diagnosis not present

## 2022-12-29 DIAGNOSIS — Z1231 Encounter for screening mammogram for malignant neoplasm of breast: Secondary | ICD-10-CM | POA: Diagnosis not present

## 2023-01-06 ENCOUNTER — Ambulatory Visit
Admission: EM | Admit: 2023-01-06 | Discharge: 2023-01-06 | Disposition: A | Discharge status: Home / Self Care | Payer: BC Managed Care – PPO | Attending: Physician Assistant | Admitting: Physician Assistant

## 2023-01-06 DIAGNOSIS — J209 Acute bronchitis, unspecified: Secondary | ICD-10-CM

## 2023-01-06 DIAGNOSIS — J0100 Acute maxillary sinusitis, unspecified: Secondary | ICD-10-CM | POA: Diagnosis not present

## 2023-01-06 MED ORDER — AMOXICILLIN-POT CLAVULANATE 875-125 MG PO TABS: 1.0000 | ORAL_TABLET | Freq: Two times a day (BID) | ORAL | 0 refills | Status: DC

## 2023-01-06 MED ORDER — PREDNISONE 20 MG PO TABS: 40.0000 mg | ORAL_TABLET | Freq: Every day | ORAL | 0 refills | Status: AC

## 2023-01-06 NOTE — ED Triage Notes (Signed)
Pt presents with congestion, productive cough and hoarseness X 3 weeks.

## 2023-01-06 NOTE — ED Provider Notes (Signed)
[facility]    CSN: [id number] Arrival date & time: 01/06/23  0816      History   Chief Complaint Chief Complaint  Patient presents with   URI   Hoarse    HPI Tonya Chan is a 37 y.o. female.   Patient here today for evaluation of congestion, productive cough and hoarseness that started 3 weeks ago. She has not had fever in at least the last 2 weeks. She denies any nausea or vomiting. She has not had sore throat. She has tried OTC meds without resolution.   The history is provided by the patient.    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 08/29/2021   BMI 40.0-44.9, adult ([place]) 08/29/2021   Rash in adult 08/21/2021   Viral rash 08/21/2021   Encounter to establish care 08/21/2021   Normal labor and delivery 05/02/2021   Term pregnancy 02/07/2019   SVD (spontaneous vaginal delivery) 02/07/2019   Normal pregnancy 05/18/2015   NSVD (normal spontaneous vaginal delivery) 05/18/2015    Past Surgical History:  Procedure Laterality Date   TYMPANOSTOMY TUBE PLACEMENT Bilateral     OB History     Gravida  5   Para  4   Term  4   Preterm  0   AB  1   Living  4      SAB  1   IAB  0   Ectopic  0   Multiple  0   Live Births  4            Home Medications    Prior to Admission medications   Medication Sig Start Date End Date Taking? Authorizing Provider  amoxicillin-clavulanate (AUGMENTIN) 875-125 MG tablet Take 1 tablet by mouth every 12 (twelve) hours. 01/06/23  Yes Francene Finders, PA-C  predniSONE (DELTASONE) 20 MG tablet Take 2 tablets (40 mg total) by mouth daily with  breakfast for 5 days. 01/06/23 01/11/23 Yes Francene Finders, PA-C  ergocalciferol (DRISDOL) 1.25 MG (50000 UT) capsule Take 1 capsule (50,000 Units total) by mouth once a week. 09/21/22   Ronnell Freshwater, NP  levonorgestrel (MIRENA, 52 MG,) 20 MCG/DAY IUD Mirena 21 mcg/24 hours (8 yrs) 52 mg intrauterine device  provided by care center    [provider]  levothyroxine (SYNTHROID) 50 MCG tablet Take 1 tablet (50 mcg total) by mouth daily. 09/21/22   Ronnell Freshwater, NP    Family History Family History  Problem Relation Age of Onset   Hypertension Father    Diabetes Maternal Uncle    Breast cancer Maternal Grandmother    Colon cancer Maternal Grandmother    Thyroid disease Maternal Grandmother    Heart disease Maternal Grandfather    Diabetes Maternal Grandfather    Bladder Cancer Maternal Grandfather    Stroke Paternal Grandfather    Lupus Mother    Pulmonary Hypertension Mother     Social History Social History   Tobacco Use   Smoking status: Former    Types: Cigarettes    Quit date: 03/18/2014    Years since quitting: 8.8   Smokeless tobacco: Former  Quit date: 01/19/2012  Vaping Use   Vaping Use: Never used  Substance Use Topics   Alcohol use: No   Drug use: No     Allergies   Patient has no known allergies.   Review of Systems Review of Systems  Constitutional:  Negative for chills and fever.  HENT:  Positive for congestion and sinus pressure. Negative for ear pain and sore throat.   Eyes:  Negative for discharge and redness.  Respiratory:  Positive for cough. Negative for shortness of breath and wheezing.   Gastrointestinal:  Negative for abdominal pain, diarrhea, nausea and vomiting.     Physical Exam Triage Vital Signs ED Triage Vitals  Enc Vitals Group     BP      Pulse      Resp      Temp      Temp src      SpO2      Weight      Height      Head Circumference      Peak Flow      Pain Score      Pain Loc      Pain Edu?      Excl.  in [place]?    No data found.  Updated Vital Signs BP (!) 145/88 (BP Location: Left Arm)   Pulse 76   Temp 97.6 F (36.4 C) (Oral)   Resp 17   SpO2 97%      Physical Exam Vitals and nursing note reviewed.  Constitutional:      General: She is not in acute distress.    Appearance: Normal appearance. She is not ill-appearing.  HENT:     Head: Normocephalic and atraumatic.     Nose: Congestion present.     Mouth/Throat:     Mouth: Mucous membranes are moist.     Pharynx: No oropharyngeal exudate or posterior oropharyngeal erythema.  Eyes:     Conjunctiva/sclera: Conjunctivae normal.  Cardiovascular:     Rate and Rhythm: Normal rate and regular rhythm.     Heart sounds: Normal heart sounds. No murmur heard. Pulmonary:     Effort: Pulmonary effort is normal. No respiratory distress.     Breath sounds: Normal breath sounds. No wheezing, rhonchi or rales.  Skin:    General: Skin is warm and dry.  Neurological:     Mental Status: She is alert.  Psychiatric:        Mood and Affect: Mood normal.        Thought Content: Thought content normal.      UC Treatments / Results  Labs (all labs ordered are listed, but only abnormal results are displayed) Labs Reviewed - No data to display  EKG   Radiology No results found.  Procedures Procedures (including critical care time)  Medications Ordered in UC Medications - No data to display  Initial Impression / Assessment and Plan / UC Course  I have reviewed the triage vital signs and the nursing notes.  Pertinent labs & imaging results that were available during my care of the patient were reviewed by me and considered in my medical decision making (see chart for details).    Will treat to cover bronchitis and sinusitis with steroid burst and augmentin. Encouraged follow up if no gradual improvement or with any further concerns.   Final Clinical Impressions(s) / UC Diagnoses   Final diagnoses:  Acute maxillary sinusitis,  recurrence not specified  Acute bronchitis, unspecified organism   Discharge  Instructions   None    ED Prescriptions     Medication Sig Dispense Auth. Provider   predniSONE (DELTASONE) 20 MG tablet Take 2 tablets (40 mg total) by mouth daily with breakfast for 5 days. 10 tablet Francene Finders, PA-C   amoxicillin-clavulanate (AUGMENTIN) 875-125 MG tablet Take 1 tablet by mouth every 12 (twelve) hours. 14 tablet Francene Finders, PA-C      PDMP not reviewed this encounter.   Francene Finders, PA-C 01/06/23 1023

## 2023-03-26 ENCOUNTER — Other Ambulatory Visit: Payer: Self-pay

## 2023-03-26 DIAGNOSIS — E039 Hypothyroidism, unspecified: Secondary | ICD-10-CM

## 2023-03-27 ENCOUNTER — Other Ambulatory Visit: Payer: BC Managed Care – PPO

## 2023-03-27 DIAGNOSIS — E039 Hypothyroidism, unspecified: Secondary | ICD-10-CM | POA: Diagnosis not present

## 2023-03-28 LAB — TSH+FREE T4
Free T4: 1.14 ng/dL (ref 0.82–1.77)
TSH: 1.14 u[IU]/mL (ref 0.450–4.500)

## 2023-03-28 LAB — T3: T3, Total: 130 ng/dL (ref 71–180)

## 2023-04-03 ENCOUNTER — Encounter: Payer: Self-pay | Admitting: Nurse Practitioner

## 2023-04-03 ENCOUNTER — Other Ambulatory Visit: Payer: Self-pay | Admitting: Nurse Practitioner

## 2023-04-03 DIAGNOSIS — E039 Hypothyroidism, unspecified: Secondary | ICD-10-CM

## 2023-04-03 MED ORDER — LEVOTHYROXINE SODIUM 50 MCG PO TABS
50.0000 ug | ORAL_TABLET | Freq: Every day | ORAL | 1 refills | Status: DC
Start: 2023-04-03 — End: 2023-10-10

## 2023-04-03 NOTE — Progress Notes (Signed)
Thyroid panel normal. Continue levothyroxine as prescribed. New prescription sent to [facility].

## 2023-10-02 ENCOUNTER — Other Ambulatory Visit: Payer: Self-pay

## 2023-10-02 DIAGNOSIS — Z0000 Encounter for general adult medical examination without abnormal findings: Secondary | ICD-10-CM

## 2023-10-02 DIAGNOSIS — E039 Hypothyroidism, unspecified: Secondary | ICD-10-CM

## 2023-10-03 ENCOUNTER — Other Ambulatory Visit: Payer: BC Managed Care – PPO

## 2023-10-03 DIAGNOSIS — Z0000 Encounter for general adult medical examination without abnormal findings: Secondary | ICD-10-CM | POA: Diagnosis not present

## 2023-10-03 DIAGNOSIS — E039 Hypothyroidism, unspecified: Secondary | ICD-10-CM | POA: Diagnosis not present

## 2023-10-04 LAB — TSH+FREE T4
Free T4: 1.13 ng/dL (ref 0.82–1.77)
TSH: 1.28 u[IU]/mL (ref 0.450–4.500)

## 2023-10-04 LAB — CBC WITH DIFFERENTIAL/PLATELET
Basophils Absolute: 0 10*3/uL (ref 0.0–0.2)
Basos: 1 %
EOS (ABSOLUTE): 0.1 10*3/uL (ref 0.0–0.4)
Eos: 2 %
Hematocrit: 44.5 % (ref 34.0–46.6)
Hemoglobin: 14.8 g/dL (ref 11.1–15.9)
Immature Grans (Abs): 0 10*3/uL (ref 0.0–0.1)
Immature Granulocytes: 0 %
Lymphocytes Absolute: 2.1 10*3/uL (ref 0.7–3.1)
Lymphs: 34 %
MCH: 30.5 pg (ref 26.6–33.0)
MCHC: 33.3 g/dL (ref 31.5–35.7)
MCV: 92 fL (ref 79–97)
Monocytes Absolute: 0.5 10*3/uL (ref 0.1–0.9)
Monocytes: 8 %
Neutrophils Absolute: 3.4 10*3/uL (ref 1.4–7.0)
Neutrophils: 55 %
Platelets: 384 10*3/uL (ref 150–450)
RBC: 4.86 x10E6/uL (ref 3.77–5.28)
RDW: 12.2 % (ref 11.7–15.4)
WBC: 6.2 10*3/uL (ref 3.4–10.8)

## 2023-10-04 LAB — COMPREHENSIVE METABOLIC PANEL
ALT: 18 [IU]/L (ref 0–32)
AST: 14 [IU]/L (ref 0–40)
Albumin: 4.6 g/dL (ref 3.9–4.9)
Alkaline Phosphatase: 66 [IU]/L (ref 44–121)
BUN/Creatinine Ratio: 17 (ref 9–23)
BUN: 11 mg/dL (ref 6–20)
Bilirubin Total: 0.4 mg/dL (ref 0.0–1.2)
CO2: 22 mmol/L (ref 20–29)
Calcium: 9.4 mg/dL (ref 8.7–10.2)
Chloride: 105 mmol/L (ref 96–106)
Creatinine, Ser: 0.66 mg/dL (ref 0.57–1.00)
Globulin, Total: 1.9 g/dL (ref 1.5–4.5)
Glucose: 88 mg/dL (ref 70–99)
Potassium: 3.9 mmol/L (ref 3.5–5.2)
Sodium: 139 mmol/L (ref 134–144)
Total Protein: 6.5 g/dL (ref 6.0–8.5)
eGFR: 115 mL/min/{1.73_m2} (ref >59)

## 2023-10-04 LAB — HEMOGLOBIN A1C
Est. average glucose Bld gHb Est-mCnc: 108 mg/dL
Hgb A1c MFr Bld: 5.4 % (ref 4.8–5.6)

## 2023-10-04 LAB — LIPID PANEL
Chol/HDL Ratio: 3 {ratio} (ref 0.0–4.4)
Cholesterol, Total: 164 mg/dL (ref 100–199)
HDL: 55 mg/dL (ref >39)
LDL Chol Calc (NIH): 89 mg/dL (ref 0–99)
Triglycerides: 111 mg/dL (ref 0–149)
VLDL Cholesterol Cal: 20 mg/dL (ref 5–40)

## 2023-10-04 LAB — T3: T3, Total: 108 ng/dL (ref 71–180)

## 2023-10-10 ENCOUNTER — Ambulatory Visit (INDEPENDENT_AMBULATORY_CARE_PROVIDER_SITE_OTHER): Payer: BC Managed Care – PPO | Admitting: Family Medicine

## 2023-10-10 VITALS — BP 105/73 | HR 84 | Resp 18 | Ht 61.0 in | Wt 212.0 lb

## 2023-10-10 DIAGNOSIS — Z1159 Encounter for screening for other viral diseases: Secondary | ICD-10-CM | POA: Diagnosis not present

## 2023-10-10 DIAGNOSIS — Z0000 Encounter for general adult medical examination without abnormal findings: Secondary | ICD-10-CM

## 2023-10-10 DIAGNOSIS — Z23 Encounter for immunization: Secondary | ICD-10-CM

## 2023-10-10 DIAGNOSIS — E039 Hypothyroidism, unspecified: Secondary | ICD-10-CM

## 2023-10-10 DIAGNOSIS — E559 Vitamin D deficiency, unspecified: Secondary | ICD-10-CM | POA: Diagnosis not present

## 2023-10-10 MED ORDER — LEVOTHYROXINE SODIUM 50 MCG PO TABS: 50.0000 ug | ORAL_TABLET | Freq: Every day | ORAL | 1 refills | Status: DC

## 2023-10-10 NOTE — Assessment & Plan Note (Addendum)
TSH and T4 within normal limits.  Continue levothyroxine 50 mcg daily.  Will continue to monitor. Recheck labs in 6 months.

## 2023-10-10 NOTE — Assessment & Plan Note (Addendum)
Continue over-the-counter vitamin D 16109 units weekly supplement.  Recheck vitamin D in 6 months.

## 2023-10-10 NOTE — Patient Instructions (Signed)
Keep up the great work!  I am so glad to hear that you are able to take some time for yourself to prioritize your physical and mental health.    If you are interested at all, there is a really great podcast that has some information about the physiology of weight management that is presented in a very empathetic and empowering way! It is called The Obesity Guide with Hilbert Bible, she is a board-certified physician in family medicine and obesity medicine and is also going through a weight management journey herself.  It is a great resource if you are just curious to learn more about how your body works, or if you want to hear about some strategies that can be helpful for metabolic health.

## 2023-10-10 NOTE — Progress Notes (Signed)
Complete physical exam  Patient: Tonya Chan   DOB: [birth year]   37 y.o. Female  MRN: [id number]  Subjective:    Chief Complaint  Patient presents with   Annual Exam    Tonya Chan is a 37 y.o. female who presents today for a complete physical exam. She reports consuming a general diet.  She has started increasing her exercise every afternoon and is working on [employer].  Progress is slow, but steady.  She generally feels well. She reports sleeping well as long as none of her 4 children wake her up, all between the ages of 2 and 52. She does not have additional problems to discuss today.    Most recent fall risk assessment:     No data to display           Most recent depression and anxiety screenings:    10/10/2023    9:21 AM 09/21/2022    1:41 PM  PHQ 2/9 Scores  PHQ - 2 Score 0 0  PHQ- 9 Score 0 0      10/10/2023    9:21 AM 09/21/2022    1:41 PM  GAD 7 : Generalized Anxiety Score  Nervous, Anxious, on Edge 0 0  Control/stop worrying 0 0  Worry too much - different things 0 0  Trouble relaxing 0 0  Restless 0 0  Easily annoyed or irritable 0 0  Afraid - awful might happen 0 0  Total GAD 7 Score 0 0  Anxiety Difficulty Not difficult at all     Patient Active Problem List   Diagnosis Date Noted   Acquired hypothyroidism 09/21/2022   Vitamin D deficiency disease 09/21/2022   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 08/29/2021   BMI 40.0-44.9, adult (HCC) 08/29/2021    Past Surgical History:  Procedure Laterality Date   TYMPANOSTOMY TUBE PLACEMENT Bilateral    Social History   Tobacco Use   Smoking status: Former    Current packs/day: 0.00    Types: Cigarettes    Quit date: 03/18/2014    Years since quitting: 9.5    Passive exposure: Past   Smokeless tobacco: Former    Quit date: 01/19/2012   Tobacco comments:    Social, irregular  Vaping Use   Vaping status: Never Used  Substance Use Topics   Alcohol use: No   Drug use: No    Family History  Problem Relation Age of Onset   Hypertension Father    Alcohol abuse Father    Hearing loss Father    Diabetes Maternal Uncle    Breast cancer Maternal Grandmother    Colon cancer Maternal Grandmother    Thyroid disease Maternal Grandmother    Cancer Maternal Grandmother    Heart disease Maternal Grandfather    Diabetes Maternal Grandfather    Bladder Cancer Maternal Grandfather    Cancer Maternal Grandfather    Stroke Paternal Grandfather    Lupus Mother    Pulmonary Hypertension Mother    No Known Allergies   Patient Care Team: Melida Quitter, PA as PCP - General (Family Medicine)   Outpatient Medications Prior to Visit  Medication Sig   ergocalciferol (VITAMIN D2) 1.25 MG (50000 UT) capsule Take 50,000 Units by mouth once a week.   levonorgestrel (MIRENA, 52 MG,) 20 MCG/DAY IUD Mirena 21 mcg/24 hours (8 yrs) 52 mg intrauterine device  provided by care center   [DISCONTINUED] amoxicillin-clavulanate (AUGMENTIN) 875-125 MG tablet Take 1 tablet by  mouth every 12 (twelve) hours.   [DISCONTINUED] ergocalciferol (DRISDOL) 1.25 MG (50000 UT) capsule Take 1 capsule (50,000 Units total) by mouth once a week.   [DISCONTINUED] levothyroxine (SYNTHROID) 50 MCG tablet Take 1 tablet (50 mcg total) by mouth daily.   No facility-administered medications prior to visit.    Review of Systems  Constitutional:  Negative for chills, fever and malaise/fatigue.  HENT:  Negative for congestion and hearing loss.   Eyes:  Negative for blurred vision and double vision.  Respiratory:  Negative for cough and shortness of breath.   Cardiovascular:  Negative for chest pain, palpitations and leg swelling.  Gastrointestinal:  Negative for abdominal pain, constipation, diarrhea and heartburn.  Genitourinary:  Negative for frequency and urgency.  Musculoskeletal:  Negative for myalgias and neck pain.  Neurological:  Negative for dizziness and headaches.  Endo/Heme/Allergies:   Negative for polydipsia.  Psychiatric/Behavioral:  Negative for depression. The patient is not nervous/anxious and does not have insomnia.       Objective:    BP 105/73 (BP Location: Left Arm, Patient Position: Sitting, Cuff Size: Large)   Pulse 84   Resp 18   Ht 5\' 1"  (1.549 m)   Wt 212 lb (96.2 kg)   LMP  (LMP Unknown)   SpO2 99%   BMI 40.06 kg/m    Physical Exam Constitutional:      General: She is not in acute distress.    Appearance: Normal appearance.  HENT:     Head: Normocephalic and atraumatic.     Right Ear: Tympanic membrane, ear canal and external ear normal.     Left Ear: Tympanic membrane, ear canal and external ear normal.     Nose: Nose normal.     Mouth/Throat:     Mouth: Mucous membranes are moist.     Pharynx: No oropharyngeal exudate or posterior oropharyngeal erythema.  Eyes:     Extraocular Movements: Extraocular movements intact.     Conjunctiva/sclera: Conjunctivae normal.     Pupils: Pupils are equal, round, and reactive to light.  Neck:     Thyroid: No thyroid mass, thyromegaly or thyroid tenderness.  Cardiovascular:     Rate and Rhythm: Normal rate and regular rhythm.     Heart sounds: Normal heart sounds. No murmur heard.    No friction rub. No gallop.  Pulmonary:     Effort: Pulmonary effort is normal. No respiratory distress.     Breath sounds: Normal breath sounds. No wheezing, rhonchi or rales.  Abdominal:     General: Abdomen is flat. Bowel sounds are normal. There is no distension.     Palpations: There is no mass.     Tenderness: There is no abdominal tenderness. There is no guarding.  Musculoskeletal:        General: Normal range of motion.     Cervical back: Normal range of motion and neck supple.  Lymphadenopathy:     Cervical: No cervical adenopathy.  Skin:    General: Skin is warm and dry.  Neurological:     Mental Status: She is alert and oriented to person, place, and time.     Cranial Nerves: No cranial nerve deficit.      Motor: No weakness.     Deep Tendon Reflexes: Reflexes normal.  Psychiatric:        Mood and Affect: Mood normal.       Assessment & Plan:    Routine Health Maintenance and Physical Exam  Immunization History  Administered Date(s)  Administered   Influenza Inj Mdck Quad Pf 11/08/2020   Influenza, Seasonal, Injecte, Preservative Fre 10/10/2023   Influenza,inj,Quad PF,6+ Mos 09/21/2022   Influenza-Unspecified 09/25/2012   Moderna SARS-COV2 Booster Vaccination 07/21/2021   Moderna Sars-Covid-2 Vaccination 05/21/2020, 06/18/2020   Tdap 07/22/2012, 01/31/2021    Health Maintenance  Topic Date Due   Hepatitis C Screening  Never done   Cervical Cancer Screening (HPV/Pap Cotest)  Never done   COVID-19 Vaccine (3 - Moderna risk series) 10/26/2023 (Originally 08/18/2021)   DTaP/Tdap/Td (3 - Td or Tdap) 01/31/2031   INFLUENZA VACCINE  Completed   HIV Screening  Completed   HPV VACCINES  Aged Out    Reviewed most recent labs including CBC, CMP, lipid panel, A1C, TSH. All within normal limits. Cervical cancer screening completed by [facility], requesting copies of most recent results.  Also has upcoming repeat Pap smear scheduled.  Discussed health benefits of physical activity, and encouraged her to engage in regular exercise appropriate for her age and condition.  Wellness examination  Acquired hypothyroidism Assessment & Plan: TSH and T4 within normal limits.  Continue levothyroxine 50 mcg daily.  Will continue to monitor. Recheck labs in 6 months.  Orders: -     Levothyroxine Sodium; Take 1 tablet (50 mcg total) by mouth daily.  Dispense: 90 tablet; Refill: 1 -     T3, free; Future -     TSH; Future -     T4, free; Future  Vitamin D deficiency disease Assessment & Plan: Continue over-the-counter vitamin D 16109 units weekly supplement.  Recheck vitamin D in 6 months.   Need for influenza vaccination -     Flu vaccine trivalent PF, 6mos and  older(Flulaval,Afluria,Fluarix,Fluzone)    Return in about 6 months (around 04/09/2024) for nonfasting blood work; 1 year for annual physical, fasting labs 1 week before.     Melida Quitter, PA

## 2023-10-11 ENCOUNTER — Encounter: Payer: Self-pay | Admitting: Obstetrics and Gynecology

## 2023-12-03 DIAGNOSIS — Z6839 Body mass index (BMI) 39.0-39.9, adult: Secondary | ICD-10-CM | POA: Diagnosis not present

## 2023-12-03 DIAGNOSIS — Z124 Encounter for screening for malignant neoplasm of cervix: Secondary | ICD-10-CM | POA: Diagnosis not present

## 2023-12-03 DIAGNOSIS — Z01419 Encounter for gynecological examination (general) (routine) without abnormal findings: Secondary | ICD-10-CM | POA: Diagnosis not present

## 2023-12-20 DIAGNOSIS — N904 Leukoplakia of vulva: Secondary | ICD-10-CM | POA: Diagnosis not present

## 2024-01-24 ENCOUNTER — Encounter: Payer: Self-pay | Admitting: Family Medicine

## 2024-03-07 ENCOUNTER — Encounter: Payer: Self-pay | Admitting: Family Medicine

## 2024-03-20 ENCOUNTER — Ambulatory Visit: Admitting: Family Medicine

## 2024-03-20 VITALS — BP 133/84 | HR 89 | Ht 61.0 in | Wt 213.0 lb

## 2024-03-20 DIAGNOSIS — Z713 Dietary counseling and surveillance: Secondary | ICD-10-CM | POA: Diagnosis not present

## 2024-03-20 DIAGNOSIS — Z6841 Body Mass Index (BMI) 40.0 and over, adult: Secondary | ICD-10-CM

## 2024-03-20 MED ORDER — ZEPBOUND 2.5 MG/0.5ML ~~LOC~~ SOAJ: 2.5000 mg | SUBCUTANEOUS | 0 refills | Status: DC

## 2024-03-20 NOTE — Progress Notes (Addendum)
   Established Patient Office Visit  Subjective   Patient ID: Tonya Chan, female    DOB: 05-22-1986  Age: 38 y.o. MRN: [id number]  Chief Complaint  Patient presents with   New Med Request    HPI  Patient here to discuss medication for weight loss, specifically Zepbound.  We discussed side effects, contraindications to this medications.  Discussed the possibility that insurance does not cover her it in her situation.  Patient has tried diet and exercise in the past.  Has done keto dieting.  Did lose some weight but was unable to continue the restrictive diet.  Patient has 4 kids, also works and has limited time to exercise.  We discussed calorie counting as a means of weight loss in addition to the medication.  Discussed how to use the different apps.  Discussed maintaining a food diary for 1 week to get a baseline calorie intake before adjusting her caloric intake..   The ASCVD Risk score (Arnett DK, et al., 2019) failed to calculate for the following reasons:   The 2019 ASCVD risk score is only valid for ages 46 to 47  Health Maintenance Due  Topic Date Due   Hepatitis C Screening  Never done   COVID-19 Vaccine (3 - Moderna risk series) 08/18/2021   Cervical Cancer Screening (HPV/Pap Cotest)  10/19/2023      Objective:     BP 133/84   Pulse 89   Ht 5\' 1"  (1.549 m)   Wt 213 lb (96.6 kg)   SpO2 99%   BMI 40.25 kg/m    Physical Exam General: Alert, oriented CV: Regular rate rhythm Pulmonary: Lungs clear bilaterally Psych: Pleasant affect   No results found for any visits on 03/20/24.      Assessment & Plan:   BMI 40.0-44.9, adult [facility]) Assessment & Plan: Prescribing Zepbound.  Discussed diet and exercise plan including calorie counting with the aid of a app on her phone, exercising 30 minutes a day for 5 days a week or 10,000 steps daily.  Pt will use calorie counting app to get a baseline caloric intake daily average and then start by subtracting 500 calories  from this amount daily with the goal of 1-2 lbs of weight loss per week. She is to adjust total daily calorie deficit as needed to achieve this goal.  Advised her that high protein foods are more satiating and higher protein intake can help with hunger while dieting.  Has no contraindications to this medication.  Discussed potential side effects.  Addendum: Last year the patient lost 25 pounds by using the following weight loss program: Workouts by the [employer]" program that included workouts such as "9-week control freak" and "21-day fix" and using food containers as part of the 21-day fix program for portion control.  With this she was able to lose weight before her plan was interrupted by changes to her schedule.  She plans to use this again with the help of the medication.   Other orders -     Zepbound; Inject 2.5 mg into the skin once a week.  Dispense: 2 mL; Refill: 0     No follow-ups on file.    Laneta Pintos, MD

## 2024-03-20 NOTE — Patient Instructions (Signed)
 It was nice to see you today,  We addressed the following topics today: -I am sending in your Zepbound.  Once this has been approved I would let you know.  If it is not approved we can discuss other options. - For diet I would recommend getting a calorie counting app, documenting your daily calories as accurately as possible for 7 days, then getting an average of your daily caloric intake at baseline. - After you get this baseline you can subtract 500 cal from this per day.  This should result in about a pound of weight loss per week if you stick to it.  If it is not then you can increase the calorie deficit as needed. - These apps include lose it, my fitness pal,cronometer - 10,000 steps per day or 30 minutes of moderate intensity exercise 5 times a week is generally recommended for any sort of cardiovascular exercise.   Have a great day,  Frederic Jericho, MD

## 2024-03-24 NOTE — Assessment & Plan Note (Addendum)
 Prescribing Zepbound.  Discussed diet and exercise plan including calorie counting with the aid of a app on her phone, exercising 30 minutes a day for 5 days a week or 10,000 steps daily.  Pt will use calorie counting app to get a baseline caloric intake daily average and then start by subtracting 500 calories from this amount daily with the goal of 1-2 lbs of weight loss per week. She is to adjust total daily calorie deficit as needed to achieve this goal.  Advised her that high protein foods are more satiating and higher protein intake can help with hunger while dieting.  Has no contraindications to this medication.  Discussed potential side effects.  Addendum: Last year the patient lost 25 pounds by using the following weight loss program: Workouts by the [employer]" program that included workouts such as "9-week control freak" and "21-day fix" and using food containers as part of the 21-day fix program for portion control.  With this she was able to lose weight before her plan was interrupted by changes to her schedule.  She plans to use this again with the help of the medication.

## 2024-04-07 ENCOUNTER — Other Ambulatory Visit: Payer: Self-pay | Admitting: Family Medicine

## 2024-04-07 DIAGNOSIS — E039 Hypothyroidism, unspecified: Secondary | ICD-10-CM

## 2024-04-09 ENCOUNTER — Other Ambulatory Visit: Payer: Self-pay | Admitting: Family Medicine

## 2024-04-09 ENCOUNTER — Other Ambulatory Visit: Payer: BC Managed Care – PPO

## 2024-04-09 DIAGNOSIS — E039 Hypothyroidism, unspecified: Secondary | ICD-10-CM

## 2024-04-09 DIAGNOSIS — E559 Vitamin D deficiency, unspecified: Secondary | ICD-10-CM | POA: Diagnosis not present

## 2024-04-09 DIAGNOSIS — Z1159 Encounter for screening for other viral diseases: Secondary | ICD-10-CM

## 2024-04-09 MED ORDER — ZEPBOUND 2.5 MG/0.5ML ~~LOC~~ SOAJ: 2.5000 mg | SUBCUTANEOUS | 0 refills | Status: DC

## 2024-04-10 LAB — T4, FREE: Free T4: 1.08 ng/dL (ref 0.82–1.77)

## 2024-04-10 LAB — HEPATITIS C ANTIBODY: Hep C Virus Ab: NONREACTIVE

## 2024-04-10 LAB — T3, FREE: T3, Free: 3 pg/mL (ref 2.0–4.4)

## 2024-04-10 LAB — TSH: TSH: 2.32 u[IU]/mL (ref 0.450–4.500)

## 2024-04-10 LAB — VITAMIN D 25 HYDROXY (VIT D DEFICIENCY, FRACTURES): Vit D, 25-Hydroxy: 75.8 ng/mL (ref 30.0–100.0)

## 2024-04-21 ENCOUNTER — Encounter: Payer: Self-pay | Admitting: Family Medicine

## 2024-04-21 ENCOUNTER — Ambulatory Visit: Admitting: Family Medicine

## 2024-04-29 ENCOUNTER — Other Ambulatory Visit: Payer: Self-pay | Admitting: Family Medicine

## 2024-04-30 ENCOUNTER — Other Ambulatory Visit: Payer: Self-pay | Admitting: Family Medicine

## 2024-05-08 ENCOUNTER — Telehealth: Payer: Self-pay | Admitting: *Deleted

## 2024-05-08 NOTE — Telephone Encounter (Signed)
 Copied from CRM [id number]. Topic: Appointments - Scheduling Inquiry for Clinic >> May 08, 2024  3:18 PM DeAngela L wrote: Reason for CRM: Patient completed her appt with Dr Arabella Beach and was told to schedule 8 week follow and patient would like to ask if there are cancellations available between 6/12-6/18 so she can get scheduled  Pt num [phone] (M)

## 2024-05-08 NOTE — Telephone Encounter (Signed)
 Contacted pt and scheduled an appointment and rescheduled her physical in October.

## 2024-06-03 ENCOUNTER — Ambulatory Visit

## 2024-06-03 VITALS — BP 102/68 | HR 89 | Ht 61.0 in | Wt 195.8 lb

## 2024-06-03 DIAGNOSIS — Z713 Dietary counseling and surveillance: Secondary | ICD-10-CM

## 2024-06-03 DIAGNOSIS — Z6841 Body Mass Index (BMI) 40.0 and over, adult: Secondary | ICD-10-CM

## 2024-06-03 NOTE — Progress Notes (Signed)
   Established Patient Office Visit  Subjective   Patient ID: Tonya Chan, female    DOB: [birth year]  Age: 38 y.o. MRN: [id number]  Chief Complaint  Patient presents with   Medication Management    HPI  Tonya Chan ss a 38 year old female who presents to clinic for follow-up on weight management.  At her last office visit in April, she was started on Zepbound  2.5 mg weekly for weight loss.  Patient is now taking 5 mg weekly.  She reports that she is tolerating this medication well and denies side effects.  Reports that she is exercising daily through walking and doing Just Dance exercises with her kids. Diet mainly consists of high protein, high fiber options when she is hungry   Reports that she is tolerating the medication well. Does endorse intermittent constipation that she treats with over-the-counter laxatives.    ROS Per HPI.    Objective:     BP 102/68   Pulse 89   Ht 5' 1 (1.549 m)   Wt 195 lb 12 oz (88.8 kg)   SpO2 100%   BMI 36.99 kg/m    Physical Exam Constitutional:      General: She is not in acute distress.    Appearance: Normal appearance.   Cardiovascular:     Rate and Rhythm: Normal rate and regular rhythm.     Heart sounds: Normal heart sounds. No murmur heard.    No friction rub. No gallop.  Pulmonary:     Effort: Pulmonary effort is normal. No respiratory distress.     Breath sounds: Normal breath sounds.   Musculoskeletal:        General: No swelling.   Skin:    General: Skin is warm and dry.   Neurological:     General: No focal deficit present.     Mental Status: She is alert.   Psychiatric:        Mood and Affect: Mood normal.        Behavior: Behavior normal.        Thought Content: Thought content normal.      No results found for any visits on 06/03/24.    The ASCVD Risk score (Arnett DK, et al., 2019) failed to calculate for the following reasons:   The 2019 ASCVD risk score is only valid for ages 59 to 41     Assessment & Plan:   BMI 40.0-44.9, adult [facility]) Assessment & Plan: Continue Zepbound  5 mg weekly. Advised patient to let me know when she feels ready to increase to 7.5 mg.  Starting weight: 213 lbs Current weight: 195 lbs  Goal weight: 150 lbs  Advised patient to continue focusing on high protein, high fiber intake. Increasing her water intake and continuing to implement daily exercise to augment weight loss. Continue using over-the-counter laxatives as needed for intermittent constipation. Will plan to reassess weight loss at her CPE with me in October.     Return for Physical, already scheduled .    Odilia Bennett, PA-C

## 2024-06-03 NOTE — Patient Instructions (Signed)
 It was nice to see you today!  As we discussed in clinic:  -You are down 18 lbs! This is awesome. Keep up the great work and continue prioritizing high protein, high fiber, and plenty of water intake. -Continue exercising daily as tolerated -We will continue at the 5 mg dose of Zepbound  until you notice that the appetite suppression is not optimal anymore, then we can increase to the next dose!  -I will plan to see you at your follow up appointment in October, with labs the week before!   It was so nice to meet you! Congratulations on your weight loss!!  If you have any problems before your next visit feel free to message me via MyChart (minor issues or questions) or call the office, otherwise you may reach out to schedule an office visit.  Thank you! Meryl Acosta, PA-C

## 2024-06-03 NOTE — Assessment & Plan Note (Signed)
 Continue Zepbound  5 mg weekly. Advised patient to let me know when she feels ready to increase to 7.5 mg.  Starting weight: 213 lbs Current weight: 195 lbs  Goal weight: 150 lbs  Advised patient to continue focusing on high protein, high fiber intake. Increasing her water intake and continuing to implement daily exercise to augment weight loss. Continue using over-the-counter laxatives as needed for intermittent constipation. Will plan to reassess weight loss at her CPE with me in October.

## 2024-06-23 MED ORDER — ZEPBOUND 7.5 MG/0.5ML ~~LOC~~ SOAJ: 7.5000 mg | SUBCUTANEOUS | 3 refills | Status: DC

## 2024-07-21 MED ORDER — ZEPBOUND 10 MG/0.5ML ~~LOC~~ SOAJ: 10.0000 mg | SUBCUTANEOUS | 2 refills | Status: DC

## 2024-07-21 NOTE — Addendum Note (Signed)
 Addended byBETHA GAYLE NUMBERS on: 07/21/2024 12:07 PM   Modules accepted: Orders

## 2024-09-09 ENCOUNTER — Other Ambulatory Visit: Payer: Self-pay

## 2024-09-09 MED ORDER — ZEPBOUND 12.5 MG/0.5ML ~~LOC~~ SOAJ: 12.5000 mg | SUBCUTANEOUS | 2 refills | Status: DC

## 2024-09-27 ENCOUNTER — Other Ambulatory Visit: Payer: Self-pay

## 2024-09-27 DIAGNOSIS — Z6841 Body Mass Index (BMI) 40.0 and over, adult: Secondary | ICD-10-CM

## 2024-10-03 ENCOUNTER — Other Ambulatory Visit: Payer: BC Managed Care – PPO

## 2024-10-03 DIAGNOSIS — Z6841 Body Mass Index (BMI) 40.0 and over, adult: Secondary | ICD-10-CM

## 2024-10-04 LAB — COMPREHENSIVE METABOLIC PANEL WITH GFR
ALT: 12 IU/L (ref 0–32)
AST: 15 IU/L (ref 0–40)
Albumin: 4.5 g/dL (ref 3.9–4.9)
Alkaline Phosphatase: 47 IU/L (ref 41–116)
BUN/Creatinine Ratio: 12 (ref 9–23)
BUN: 9 mg/dL (ref 6–20)
Bilirubin Total: 0.7 mg/dL (ref 0.0–1.2)
CO2: 20 mmol/L (ref 20–29)
Calcium: 9.5 mg/dL (ref 8.7–10.2)
Chloride: 103 mmol/L (ref 96–106)
Creatinine, Ser: 0.73 mg/dL (ref 0.57–1.00)
Globulin, Total: 1.9 g/dL (ref 1.5–4.5)
Glucose: 75 mg/dL (ref 70–99)
Potassium: 3.9 mmol/L (ref 3.5–5.2)
Sodium: 140 mmol/L (ref 134–144)
Total Protein: 6.4 g/dL (ref 6.0–8.5)
eGFR: 107 mL/min/1.73 (ref >59)

## 2024-10-04 LAB — CBC WITH DIFFERENTIAL/PLATELET
Basophils Absolute: 0 x10E3/uL (ref 0.0–0.2)
Basos: 0 %
EOS (ABSOLUTE): 0.1 x10E3/uL (ref 0.0–0.4)
Eos: 1 %
Hematocrit: 43.4 % (ref 34.0–46.6)
Hemoglobin: 14.7 g/dL (ref 11.1–15.9)
Immature Grans (Abs): 0 x10E3/uL (ref 0.0–0.1)
Immature Granulocytes: 0 %
Lymphocytes Absolute: 1.6 x10E3/uL (ref 0.7–3.1)
Lymphs: 32 %
MCH: 31.5 pg (ref 26.6–33.0)
MCHC: 33.9 g/dL (ref 31.5–35.7)
MCV: 93 fL (ref 79–97)
Monocytes Absolute: 0.5 x10E3/uL (ref 0.1–0.9)
Monocytes: 10 %
Neutrophils Absolute: 2.8 x10E3/uL (ref 1.4–7.0)
Neutrophils: 57 %
Platelets: 327 x10E3/uL (ref 150–450)
RBC: 4.67 x10E6/uL (ref 3.77–5.28)
RDW: 12.2 % (ref 11.7–15.4)
WBC: 5 x10E3/uL (ref 3.4–10.8)

## 2024-10-04 LAB — HEMOGLOBIN A1C
Est. average glucose Bld gHb Est-mCnc: 94 mg/dL
Hgb A1c MFr Bld: 4.9 % (ref 4.8–5.6)

## 2024-10-04 LAB — LIPID PANEL
Chol/HDL Ratio: 3 ratio (ref 0.0–4.4)
Cholesterol, Total: 144 mg/dL (ref 100–199)
HDL: 48 mg/dL (ref >39)
LDL Chol Calc (NIH): 85 mg/dL (ref 0–99)
Triglycerides: 49 mg/dL (ref 0–149)
VLDL Cholesterol Cal: 11 mg/dL (ref 5–40)

## 2024-10-04 LAB — TSH: TSH: 2.07 u[IU]/mL (ref 0.450–4.500)

## 2024-10-04 LAB — VITAMIN D 25 HYDROXY (VIT D DEFICIENCY, FRACTURES): Vit D, 25-Hydroxy: 124 ng/mL — ABNORMAL HIGH (ref 30.0–100.0)

## 2024-10-06 ENCOUNTER — Ambulatory Visit: Payer: Self-pay

## 2024-10-10 ENCOUNTER — Encounter: Payer: BC Managed Care – PPO | Admitting: Family Medicine

## 2024-10-10 ENCOUNTER — Ambulatory Visit (INDEPENDENT_AMBULATORY_CARE_PROVIDER_SITE_OTHER)

## 2024-10-10 VITALS — BP 113/76 | HR 85 | Temp 97.5°F | Ht 61.0 in | Wt 159.1 lb

## 2024-10-10 DIAGNOSIS — Z0000 Encounter for general adult medical examination without abnormal findings: Secondary | ICD-10-CM

## 2024-10-10 DIAGNOSIS — E039 Hypothyroidism, unspecified: Secondary | ICD-10-CM

## 2024-10-10 DIAGNOSIS — Z23 Encounter for immunization: Secondary | ICD-10-CM | POA: Diagnosis not present

## 2024-10-10 DIAGNOSIS — E559 Vitamin D deficiency, unspecified: Secondary | ICD-10-CM | POA: Diagnosis not present

## 2024-10-10 DIAGNOSIS — M545 Low back pain, unspecified: Secondary | ICD-10-CM

## 2024-10-10 DIAGNOSIS — Z6841 Body Mass Index (BMI) 40.0 and over, adult: Secondary | ICD-10-CM

## 2024-10-10 MED ORDER — ZEPBOUND 15 MG/0.5ML ~~LOC~~ SOAJ: 15.0000 mg | SUBCUTANEOUS | 3 refills | Status: DC

## 2024-10-10 MED ORDER — CYCLOBENZAPRINE HCL 5 MG PO TABS: 5.0000 mg | ORAL_TABLET | Freq: Two times a day (BID) | ORAL | 0 refills | Status: AC | PRN

## 2024-10-10 MED ORDER — VITAMIN D3 250 MCG (10000 UT) PO CAPS: 10000.0000 [IU] | ORAL_CAPSULE | ORAL | 2 refills | Status: AC

## 2024-10-10 NOTE — Assessment & Plan Note (Signed)
 Significant weight loss from 213 lbs to 159 lbs. Currently on Zepbound  with plans to increase to 15 mg today. Discussed long-term Zepbound  plan to minimize rebound weight gain. - Increase Zepbound  to maintenance dose. - Continue regular exercise and healthy eating habits. - Schedule follow-up in six months to monitor weight and thyroid  function. -Advised patient to continue focusing on high protein, high fiber intake. Increasing her water intake and continuing to implement daily exercise to augment weight loss. Continue using over-the-counter laxatives as needed for intermittent constipation.

## 2024-10-10 NOTE — Assessment & Plan Note (Signed)
 TSH within normal limits. Continue Synthroid  50 mg daily.

## 2024-10-10 NOTE — Assessment & Plan Note (Signed)
 Vitamin D  levels slightly elevated due to high-dose supplementation. Discussed risks of high vitamin D  levels. - Reduce vitamin D  supplementation to 10,000 IU weekly. - Send prescription to [facility] on [address].

## 2024-10-10 NOTE — Patient Instructions (Signed)
 VISIT SUMMARY: Today, you had your annual physical exam and medication review. We discussed your recent weight loss, back pain, vitamin D  supplementation, and general health maintenance.  YOUR PLAN: MUSCLE SPASM OF BACK: You have intermittent sharp pain on the right side of your back, likely due to muscle spasms from exercise. -Use ibuprofen  as needed for pain relief. -Consider a muscle relaxer if the pain becomes severe and ibuprofen  is not enough.  OBESITY: You have lost a significant amount of weight and are currently taking Zepbound . -Increase Zepbound  to the maintenance dose. -Continue your regular exercise and healthy eating habits. -Schedule a follow-up in six months to monitor your weight and thyroid  function.  HYPOTHYROIDISM: Your hypothyroidism is well-managed with Synthroid . -Continue taking Synthroid  50 mg daily. -Monitor for the need of a refill and notify us  if required.  VITAMIN D  EXCESS: Your vitamin D  levels are slightly elevated due to high-dose supplementation. -Reduce vitamin D  supplementation to 10,000 IU weekly. -We will send the new prescription to [facility] on [address].  GENERAL HEALTH MAINTENANCE: Routine health maintenance was discussed. -Administer flu shot today. -Schedule a mammogram after your 39th birthday. -Monitor your family history for any changes that might affect your colonoscopy schedule.  If you have any problems before your next visit feel free to message me via MyChart (minor issues or questions) or call the office, otherwise you may reach out to schedule an office visit.  Thank you! Saddie Sacks, PA-C

## 2024-10-10 NOTE — Assessment & Plan Note (Signed)
 Intermittent sharp right-sided back pain likely due to exercise-related muscle spasms. Ibuprofen  provides relief. - Use ibuprofen  as needed for pain. - Flexeril 5 mg PRN for severe spasms

## 2024-10-10 NOTE — Progress Notes (Signed)
 Complete physical exam  Patient: Tonya Chan   DOB: [birth year]   38 y.o. Female  MRN: [id number]  Subjective:    Chief Complaint  Patient presents with   Annual Exam    Physical    History of Present Illness   Tonya Chan is a 38 year old female who presents for an annual physical exam and medication review.   Weight loss - Significant weight loss from 212 pounds to 159 pounds over the past year - Attributes weight loss to increased physical activity, including a five-day-a-week workout routine and participation in a workplace accountability group - Utilizes a fitness tracker to monitor daily steps - Currently taking Zepbound  12.5 mg  Back pain - Sharp pain localized to the right side of the lower back - Pain is persistent throughout the day when present - Duration of symptoms for a couple of weeks, but no pain in the last few days - Ibuprofen  provides relief - Pain possibly related to increased physical activity and workouts - No associated numbness, tingling, recent falls, or injuries  Vitamin d  supplementation - Taking vitamin D  supplement 50,000 units once weekly for the past couple of years     CPE Feeling well  Sleeping well  Regular bowel movements     Most recent fall risk assessment:    10/10/2024    8:20 AM  Fall Risk   Falls in the past year? 0  Follow up Falls evaluation completed     Most recent depression screenings:    10/10/2024    8:21 AM 10/10/2024    8:20 AM  PHQ 2/9 Scores  PHQ - 2 Score 0 0  PHQ- 9 Score 0         Patient Care Team: Gayle Saddie JULIANNA DEVONNA as PCP - General (Physician Assistant)   Outpatient Medications Prior to Visit  Medication Sig   levonorgestrel  (MIRENA , 52 MG,) 20 MCG/DAY IUD Mirena  21 mcg/24 hours (8 yrs) 52 mg intrauterine device  provided by care center   levothyroxine  (SYNTHROID ) 50 MCG tablet TAKE 1 TABLET DAILY   [DISCONTINUED] ergocalciferol  (VITAMIN D2) 1.25 MG (50000 UT) capsule Take 50,000  Units by mouth once a week.   [DISCONTINUED] tirzepatide  (ZEPBOUND ) 12.5 MG/0.5ML Pen Inject 12.5 mg into the skin once a week.   No facility-administered medications prior to visit.    ROS   Per HPI     Objective:     BP 113/76   Pulse 85   Temp (!) 97.5 F (36.4 C) (Oral)   Ht 5' 1 (1.549 m)   Wt 159 lb 1.9 oz (72.2 kg)   SpO2 100%   BMI 30.07 kg/m    Physical Exam Constitutional:      General: She is not in acute distress.    Appearance: Normal appearance.  Cardiovascular:     Rate and Rhythm: Normal rate and regular rhythm.     Heart sounds: Normal heart sounds. No murmur heard.    No friction rub. No gallop.  Pulmonary:     Effort: Pulmonary effort is normal. No respiratory distress.     Breath sounds: Normal breath sounds.  Abdominal:     General: Bowel sounds are normal.  Musculoskeletal:        General: No swelling.     Cervical back: Neck supple.     Lumbar back: Spasms and tenderness present.  Lymphadenopathy:     Cervical: No cervical adenopathy.  Skin:    General: Skin is  warm and dry.  Neurological:     General: No focal deficit present.     Mental Status: She is alert.  Psychiatric:        Mood and Affect: Mood normal.        Behavior: Behavior normal.        Thought Content: Thought content normal.       No results found for any visits on 10/10/24. Last CBC Lab Results  Component Value Date   WBC 5.0 10/03/2024   HGB 14.7 10/03/2024   HCT 43.4 10/03/2024   MCV 93 10/03/2024   MCH 31.5 10/03/2024   RDW 12.2 10/03/2024   PLT 327 10/03/2024   Last metabolic panel Lab Results  Component Value Date   GLUCOSE 75 10/03/2024   NA 140 10/03/2024   K 3.9 10/03/2024   CL 103 10/03/2024   CO2 20 10/03/2024   BUN 9 10/03/2024   CREATININE 0.73 10/03/2024   EGFR 107 10/03/2024   CALCIUM 9.5 10/03/2024   PROT 6.4 10/03/2024   ALBUMIN 4.5 10/03/2024   LABGLOB 1.9 10/03/2024   AGRATIO 2.3 (H) 09/13/2022   BILITOT 0.7 10/03/2024    ALKPHOS 47 10/03/2024   AST 15 10/03/2024   ALT 12 10/03/2024   ANIONGAP 5 05/17/2015   Last lipids Lab Results  Component Value Date   CHOL 144 10/03/2024   HDL 48 10/03/2024   LDLCALC 85 10/03/2024   TRIG 49 10/03/2024   CHOLHDL 3.0 10/03/2024   Last hemoglobin A1c Lab Results  Component Value Date   HGBA1C 4.9 10/03/2024   Last thyroid  functions Lab Results  Component Value Date   TSH 2.070 10/03/2024   T3TOTAL 108 10/03/2023   FREET4 1.08 04/09/2024   Last vitamin D  Lab Results  Component Value Date   VD25OH 124.0 (H) 10/03/2024        Assessment & Plan:    Routine Health Maintenance and Physical Exam  Health Maintenance  Topic Date Due   Hepatitis B Vaccine (1 of 3 - 19+ 3-dose series) Never done   HPV Vaccine (1 - Risk 3-dose SCDM series) Never done   COVID-19 Vaccine (3 - Moderna risk series) 08/18/2021   Pap with HPV screening  10/19/2023   DTaP/Tdap/Td vaccine (3 - Td or Tdap) 01/31/2031   Flu Shot  Completed   Hepatitis C Screening  Completed   HIV Screening  Completed   Pneumococcal Vaccine  Aged Out   Meningitis B Vaccine  Aged Out    Discussed health benefits of physical activity, and encouraged her to engage in regular exercise appropriate for her age and condition.  Encounter for vaccination -     Flu vaccine trivalent PF, 6mos and older(Flulaval,Afluria,Fluarix,Fluzone)  BMI 40.0-44.9, adult (HCC) Assessment & Plan: Significant weight loss from 213 lbs to 159 lbs. Currently on Zepbound  with plans to increase to 15 mg today. Discussed long-term Zepbound  plan to minimize rebound weight gain. - Increase Zepbound  to maintenance dose. - Continue regular exercise and healthy eating habits. - Schedule follow-up in six months to monitor weight and thyroid  function. -Advised patient to continue focusing on high protein, high fiber intake. Increasing her water intake and continuing to implement daily exercise to augment weight loss. Continue using  over-the-counter laxatives as needed for intermittent constipation.   Orders: -     Zepbound ; Inject 15 mg into the skin once a week.  Dispense: 2 mL; Refill: 3  Vitamin D  deficiency disease Assessment & Plan: Vitamin D  levels slightly  elevated due to high-dose supplementation. Discussed risks of high vitamin D  levels. - Reduce vitamin D  supplementation to 10,000 IU weekly. - Send prescription to [facility] on [address].   Acquired hypothyroidism Assessment & Plan: TSH within normal limits. Continue Synthroid  50 mg daily.    Acute right-sided low back pain without sciatica Assessment & Plan: Intermittent sharp right-sided back pain likely due to exercise-related muscle spasms. Ibuprofen  provides relief. - Use ibuprofen  as needed for pain. - Flexeril 5 mg PRN for severe spasms    Other orders -     Vitamin D3; Take 1 capsule (10,000 Units total) by mouth once a week.  Dispense: 12 capsule; Refill: 2    Return in about 6 months (around 04/10/2025) for Thyroid , weight, vit D.     Saddie JULIANNA Sacks, PA-C

## 2024-11-12 ENCOUNTER — Other Ambulatory Visit (HOSPITAL_COMMUNITY): Payer: Self-pay

## 2024-11-12 ENCOUNTER — Telehealth: Payer: Self-pay | Admitting: Pharmacy Technician

## 2024-11-12 DIAGNOSIS — Z6841 Body Mass Index (BMI) 40.0 and over, adult: Secondary | ICD-10-CM

## 2024-11-12 MED ORDER — ZEPBOUND 15 MG/0.5ML ~~LOC~~ SOAJ: 15.0000 mg | SUBCUTANEOUS | 3 refills | Status: AC

## 2024-11-12 NOTE — Telephone Encounter (Signed)
 Pharmacy Patient Advocate Encounter   Received notification from Onbase that prior authorization for Zepbound  15MG /0.5ML pen-injectors is required/requested.   Insurance verification completed.   The patient is insured through [facility].   Per test claim: PA required; PA submitted to above mentioned insurance via Latent Key/confirmation #/EOC [id number] Status is pending

## 2024-11-12 NOTE — Telephone Encounter (Signed)
 Pharmacy Patient Advocate Encounter  Received notification from [employer] that Prior Authorization for Zepbound  15MG /0.5ML pen-injectors has been APPROVED from 11/12/2024 to 11/11/2025. Ran test claim, Copay is $24.99. This test claim was processed through [facility]- copay amounts may vary at other pharmacies due to pharmacy/plan contracts, or as the patient moves through the different stages of their insurance plan.   PA #/Case ID/Reference #: [id number]

## 2024-12-03 DIAGNOSIS — Z6826 Body mass index (BMI) 26.0-26.9, adult: Secondary | ICD-10-CM | POA: Diagnosis not present

## 2024-12-03 DIAGNOSIS — Z01419 Encounter for gynecological examination (general) (routine) without abnormal findings: Secondary | ICD-10-CM | POA: Diagnosis not present

## 2024-12-03 DIAGNOSIS — N904 Leukoplakia of vulva: Secondary | ICD-10-CM | POA: Diagnosis not present

## 2025-04-03 ENCOUNTER — Other Ambulatory Visit

## 2025-04-10 ENCOUNTER — Ambulatory Visit
